# Patient Record
Sex: Male | Born: 1935 | Race: White | Hispanic: No | Marital: Married | Smoking: Never smoker
Health system: Southern US, Community
[De-identification: ages and names within clinical notes are randomized; demographics above are authoritative.]

## PROBLEM LIST (undated history)

## (undated) DIAGNOSIS — K219 Gastro-esophageal reflux disease without esophagitis: Secondary | ICD-10-CM

## (undated) DIAGNOSIS — I4819 Other persistent atrial fibrillation: Secondary | ICD-10-CM

## (undated) DIAGNOSIS — I214 Non-ST elevation (NSTEMI) myocardial infarction: Secondary | ICD-10-CM

## (undated) DIAGNOSIS — I10 Essential (primary) hypertension: Secondary | ICD-10-CM

## (undated) DIAGNOSIS — I2510 Atherosclerotic heart disease of native coronary artery without angina pectoris: Secondary | ICD-10-CM

## (undated) DIAGNOSIS — R7989 Other specified abnormal findings of blood chemistry: Secondary | ICD-10-CM

## (undated) DIAGNOSIS — E7800 Pure hypercholesterolemia, unspecified: Secondary | ICD-10-CM

## (undated) DIAGNOSIS — Z8674 Personal history of sudden cardiac arrest: Secondary | ICD-10-CM

## (undated) DIAGNOSIS — I5022 Chronic systolic (congestive) heart failure: Secondary | ICD-10-CM

## (undated) DIAGNOSIS — Z9289 Personal history of other medical treatment: Secondary | ICD-10-CM

## (undated) DIAGNOSIS — I82409 Acute embolism and thrombosis of unspecified deep veins of unspecified lower extremity: Secondary | ICD-10-CM

## (undated) DIAGNOSIS — Z95828 Presence of other vascular implants and grafts: Secondary | ICD-10-CM

## (undated) DIAGNOSIS — I255 Ischemic cardiomyopathy: Secondary | ICD-10-CM

## (undated) DIAGNOSIS — I420 Dilated cardiomyopathy: Secondary | ICD-10-CM

## (undated) DIAGNOSIS — I749 Embolism and thrombosis of unspecified artery: Secondary | ICD-10-CM

## (undated) DIAGNOSIS — Z955 Presence of coronary angioplasty implant and graft: Secondary | ICD-10-CM

## (undated) DIAGNOSIS — M109 Gout, unspecified: Secondary | ICD-10-CM

## (undated) DIAGNOSIS — E119 Type 2 diabetes mellitus without complications: Secondary | ICD-10-CM

## (undated) DIAGNOSIS — I350 Nonrheumatic aortic (valve) stenosis: Secondary | ICD-10-CM

## (undated) DIAGNOSIS — I2699 Other pulmonary embolism without acute cor pulmonale: Secondary | ICD-10-CM

## (undated) DIAGNOSIS — Z8719 Personal history of other diseases of the digestive system: Secondary | ICD-10-CM

## (undated) HISTORY — DX: Dilated cardiomyopathy: I42.0

## (undated) HISTORY — DX: Nonrheumatic aortic (valve) stenosis: I35.0

## (undated) HISTORY — PX: CARDIAC CATHETERIZATION: SHX172

## (undated) HISTORY — DX: Ischemic cardiomyopathy: I25.5

## (undated) HISTORY — PX: GLAUCOMA SURGERY: SHX656

## (undated) HISTORY — PX: APPENDECTOMY: SHX54

## (undated) HISTORY — DX: Chronic systolic (congestive) heart failure: I50.22

## (undated) HISTORY — PX: CORONARY ANGIOPLASTY WITH STENT PLACEMENT: SHX49

## (undated) HISTORY — DX: Other persistent atrial fibrillation: I48.19

## (undated) HISTORY — PX: TIBIA FRACTURE SURGERY: SHX806

---

## 1989-05-07 HISTORY — PX: AORTA - BILATERAL FEMORAL ARTERY BYPASS GRAFT: SHX1175

## 2013-12-17 ENCOUNTER — Emergency Department (HOSPITAL_COMMUNITY): Payer: Medicare Other

## 2013-12-17 ENCOUNTER — Encounter (HOSPITAL_COMMUNITY): Payer: Self-pay | Admitting: Emergency Medicine

## 2013-12-17 ENCOUNTER — Inpatient Hospital Stay (HOSPITAL_COMMUNITY)
Admission: EM | Admit: 2013-12-17 | Discharge: 2013-12-19 | DRG: 280 | Disposition: A | Discharge status: Home / Self Care | Payer: Medicare Other | Attending: Internal Medicine | Admitting: Internal Medicine

## 2013-12-17 DIAGNOSIS — Z86711 Personal history of pulmonary embolism: Secondary | ICD-10-CM

## 2013-12-17 DIAGNOSIS — Z7901 Long term (current) use of anticoagulants: Secondary | ICD-10-CM

## 2013-12-17 DIAGNOSIS — N179 Acute kidney failure, unspecified: Secondary | ICD-10-CM | POA: Diagnosis not present

## 2013-12-17 DIAGNOSIS — I252 Old myocardial infarction: Secondary | ICD-10-CM

## 2013-12-17 DIAGNOSIS — I350 Nonrheumatic aortic (valve) stenosis: Secondary | ICD-10-CM

## 2013-12-17 DIAGNOSIS — I2510 Atherosclerotic heart disease of native coronary artery without angina pectoris: Secondary | ICD-10-CM

## 2013-12-17 DIAGNOSIS — I214 Non-ST elevation (NSTEMI) myocardial infarction: Principal | ICD-10-CM

## 2013-12-17 DIAGNOSIS — R079 Chest pain, unspecified: Secondary | ICD-10-CM

## 2013-12-17 DIAGNOSIS — Z9861 Coronary angioplasty status: Secondary | ICD-10-CM

## 2013-12-17 DIAGNOSIS — Z79899 Other long term (current) drug therapy: Secondary | ICD-10-CM

## 2013-12-17 DIAGNOSIS — I2699 Other pulmonary embolism without acute cor pulmonale: Secondary | ICD-10-CM | POA: Diagnosis present

## 2013-12-17 DIAGNOSIS — I509 Heart failure, unspecified: Secondary | ICD-10-CM

## 2013-12-17 DIAGNOSIS — I5023 Acute on chronic systolic (congestive) heart failure: Secondary | ICD-10-CM | POA: Diagnosis present

## 2013-12-17 DIAGNOSIS — E1169 Type 2 diabetes mellitus with other specified complication: Secondary | ICD-10-CM | POA: Diagnosis present

## 2013-12-17 DIAGNOSIS — I10 Essential (primary) hypertension: Secondary | ICD-10-CM | POA: Diagnosis present

## 2013-12-17 DIAGNOSIS — J9819 Other pulmonary collapse: Secondary | ICD-10-CM | POA: Diagnosis present

## 2013-12-17 DIAGNOSIS — I5022 Chronic systolic (congestive) heart failure: Secondary | ICD-10-CM | POA: Insufficient documentation

## 2013-12-17 DIAGNOSIS — Z95828 Presence of other vascular implants and grafts: Secondary | ICD-10-CM

## 2013-12-17 DIAGNOSIS — T502X5A Adverse effect of carbonic-anhydrase inhibitors, benzothiadiazides and other diuretics, initial encounter: Secondary | ICD-10-CM | POA: Diagnosis not present

## 2013-12-17 DIAGNOSIS — I739 Peripheral vascular disease, unspecified: Secondary | ICD-10-CM | POA: Diagnosis present

## 2013-12-17 DIAGNOSIS — H9190 Unspecified hearing loss, unspecified ear: Secondary | ICD-10-CM | POA: Diagnosis present

## 2013-12-17 DIAGNOSIS — M109 Gout, unspecified: Secondary | ICD-10-CM | POA: Diagnosis present

## 2013-12-17 DIAGNOSIS — Z8674 Personal history of sudden cardiac arrest: Secondary | ICD-10-CM

## 2013-12-17 DIAGNOSIS — Z7982 Long term (current) use of aspirin: Secondary | ICD-10-CM

## 2013-12-17 DIAGNOSIS — E119 Type 2 diabetes mellitus without complications: Secondary | ICD-10-CM

## 2013-12-17 DIAGNOSIS — I4891 Unspecified atrial fibrillation: Secondary | ICD-10-CM | POA: Diagnosis present

## 2013-12-17 DIAGNOSIS — R7989 Other specified abnormal findings of blood chemistry: Secondary | ICD-10-CM

## 2013-12-17 DIAGNOSIS — I498 Other specified cardiac arrhythmias: Secondary | ICD-10-CM | POA: Diagnosis present

## 2013-12-17 DIAGNOSIS — I359 Nonrheumatic aortic valve disorder, unspecified: Secondary | ICD-10-CM | POA: Diagnosis present

## 2013-12-17 DIAGNOSIS — R778 Other specified abnormalities of plasma proteins: Secondary | ICD-10-CM

## 2013-12-17 HISTORY — DX: Personal history of other diseases of the digestive system: Z87.19

## 2013-12-17 HISTORY — DX: Other specified abnormal findings of blood chemistry: R79.89

## 2013-12-17 HISTORY — DX: Gout, unspecified: M10.9

## 2013-12-17 HISTORY — DX: Non-ST elevation (NSTEMI) myocardial infarction: I21.4

## 2013-12-17 HISTORY — DX: Acute embolism and thrombosis of unspecified deep veins of unspecified lower extremity: I82.409

## 2013-12-17 HISTORY — DX: Nonrheumatic aortic (valve) stenosis: I35.0

## 2013-12-17 HISTORY — DX: Presence of other vascular implants and grafts: Z95.828

## 2013-12-17 HISTORY — DX: Pure hypercholesterolemia, unspecified: E78.00

## 2013-12-17 HISTORY — DX: Personal history of other medical treatment: Z92.89

## 2013-12-17 HISTORY — DX: Gastro-esophageal reflux disease without esophagitis: K21.9

## 2013-12-17 HISTORY — DX: Type 2 diabetes mellitus without complications: E11.9

## 2013-12-17 HISTORY — DX: Presence of coronary angioplasty implant and graft: Z95.5

## 2013-12-17 HISTORY — DX: Personal history of sudden cardiac arrest: Z86.74

## 2013-12-17 HISTORY — DX: Embolism and thrombosis of unspecified artery: I74.9

## 2013-12-17 HISTORY — DX: Atherosclerotic heart disease of native coronary artery without angina pectoris: I25.10

## 2013-12-17 HISTORY — DX: Other pulmonary embolism without acute cor pulmonale: I26.99

## 2013-12-17 HISTORY — DX: Other specified abnormalities of plasma proteins: R77.8

## 2013-12-17 HISTORY — DX: Essential (primary) hypertension: I10

## 2013-12-17 LAB — CBC
HCT: 46.4 % (ref 39.0–52.0)
HEMOGLOBIN: 15.6 g/dL (ref 13.0–17.0)
MCH: 31 pg (ref 26.0–34.0)
MCHC: 33.6 g/dL (ref 30.0–36.0)
MCV: 92.2 fL (ref 78.0–100.0)
Platelets: 209 10*3/uL (ref 150–400)
RBC: 5.03 MIL/uL (ref 4.22–5.81)
RDW: 14.3 % (ref 11.5–15.5)
WBC: 10.9 10*3/uL — ABNORMAL HIGH (ref 4.0–10.5)

## 2013-12-17 LAB — GLUCOSE, CAPILLARY
GLUCOSE-CAPILLARY: 215 mg/dL — AB (ref 70–99)
GLUCOSE-CAPILLARY: 175 mg/dL — AB (ref 70–99)
Glucose-Capillary: 230 mg/dL — ABNORMAL HIGH (ref 70–99)
Glucose-Capillary: 163 mg/dL — ABNORMAL HIGH (ref 70–99)

## 2013-12-17 LAB — BASIC METABOLIC PANEL
BUN: 19 mg/dL (ref 6–23)
CALCIUM: 11.7 mg/dL — AB (ref 8.4–10.5)
CO2: 22 mEq/L (ref 19–32)
Chloride: 97 mEq/L (ref 96–112)
Creatinine, Ser: 1.26 mg/dL (ref 0.50–1.35)
GFR, EST AFRICAN AMERICAN: 59 mL/min — AB (ref >90)
GFR, EST NON AFRICAN AMERICAN: 51 mL/min — AB (ref >90)
Glucose, Bld: 233 mg/dL — ABNORMAL HIGH (ref 70–99)
Potassium: 4.7 mEq/L (ref 3.7–5.3)
SODIUM: 138 meq/L (ref 137–147)

## 2013-12-17 LAB — INFLUENZA PANEL BY PCR (TYPE A & B)
H1N1 flu by pcr: NOT DETECTED
INFLAPCR: NEGATIVE
Influenza B By PCR: NEGATIVE

## 2013-12-17 LAB — PRO B NATRIURETIC PEPTIDE: Pro B Natriuretic peptide (BNP): 2955 pg/mL — ABNORMAL HIGH (ref 0–450)

## 2013-12-17 LAB — I-STAT TROPONIN, ED: TROPONIN I, POC: 0 ng/mL (ref 0.00–0.08)

## 2013-12-17 LAB — PROTIME-INR
INR: 2.23 — ABNORMAL HIGH (ref 0.00–1.49)
Prothrombin Time: 24 seconds — ABNORMAL HIGH (ref 11.6–15.2)

## 2013-12-17 LAB — TSH: TSH: 2.78 u[IU]/mL (ref 0.350–4.500)

## 2013-12-17 LAB — STREP PNEUMONIAE URINARY ANTIGEN: STREP PNEUMO URINARY ANTIGEN: NEGATIVE

## 2013-12-17 LAB — TROPONIN I
Troponin I: <0.3 ng/mL (ref <0.30)
Troponin I: 0.34 ng/mL (ref <0.30)
Troponin I: <0.3 ng/mL (ref <0.30)

## 2013-12-17 MED ORDER — ONDANSETRON HCL 4 MG/2ML IJ SOLN
4.0000 mg | Freq: Once | INTRAMUSCULAR | Status: AC
  Administered 2013-12-17: 4 mg via INTRAVENOUS

## 2013-12-17 MED ORDER — LISINOPRIL 40 MG PO TABS
40.0000 mg | ORAL_TABLET | Freq: Every day | ORAL | Status: DC
  Administered 2013-12-17: 40 mg via ORAL
  Filled 2013-12-17 (×2): qty 1

## 2013-12-17 MED ORDER — INSULIN ASPART 100 UNIT/ML ~~LOC~~ SOLN: 0.0000 [IU] | Freq: Every day | SUBCUTANEOUS | Status: DC

## 2013-12-17 MED ORDER — ONDANSETRON HCL 4 MG PO TABS: 4.0000 mg | ORAL_TABLET | Freq: Four times a day (QID) | ORAL | Status: DC | PRN

## 2013-12-17 MED ORDER — FUROSEMIDE 10 MG/ML IJ SOLN
40.0000 mg | Freq: Two times a day (BID) | INTRAMUSCULAR | Status: DC
  Administered 2013-12-17 – 2013-12-18 (×2): 40 mg via INTRAVENOUS
  Filled 2013-12-17 (×4): qty 4

## 2013-12-17 MED ORDER — ASPIRIN EC 81 MG PO TBEC
81.0000 mg | DELAYED_RELEASE_TABLET | Freq: Every day | ORAL | Status: DC
  Administered 2013-12-17: 81 mg via ORAL
  Filled 2013-12-17: qty 1

## 2013-12-17 MED ORDER — ASPIRIN EC 325 MG PO TBEC
325.0000 mg | DELAYED_RELEASE_TABLET | Freq: Every day | ORAL | Status: DC
  Administered 2013-12-17 – 2013-12-19 (×3): 325 mg via ORAL
  Filled 2013-12-17 (×3): qty 1

## 2013-12-17 MED ORDER — FENOFIBRATE 54 MG PO TABS
54.0000 mg | ORAL_TABLET | Freq: Every day | ORAL | Status: DC
  Administered 2013-12-17 – 2013-12-19 (×3): 54 mg via ORAL
  Filled 2013-12-17 (×3): qty 1

## 2013-12-17 MED ORDER — WARFARIN SODIUM 5 MG PO TABS
5.0000 mg | ORAL_TABLET | ORAL | Status: DC
  Administered 2013-12-17 – 2013-12-18 (×2): 5 mg via ORAL
  Filled 2013-12-17 (×3): qty 1

## 2013-12-17 MED ORDER — FUROSEMIDE 10 MG/ML IJ SOLN
40.0000 mg | Freq: Once | INTRAMUSCULAR | Status: AC
  Administered 2013-12-17: 40 mg via INTRAVENOUS
  Filled 2013-12-17: qty 4

## 2013-12-17 MED ORDER — SODIUM CHLORIDE 0.9 % IJ SOLN
3.0000 mL | Freq: Two times a day (BID) | INTRAMUSCULAR | Status: DC
  Administered 2013-12-17 – 2013-12-19 (×5): 3 mL via INTRAVENOUS

## 2013-12-17 MED ORDER — WARFARIN - PHARMACIST DOSING INPATIENT
Freq: Every day | Status: DC
  Administered 2013-12-17 – 2013-12-18 (×2)

## 2013-12-17 MED ORDER — TIMOLOL MALEATE 0.5 % OP SOLN
1.0000 [drp] | Freq: Every day | OPHTHALMIC | Status: DC
  Administered 2013-12-17 – 2013-12-19 (×3): 1 [drp] via OPHTHALMIC
  Filled 2013-12-17: qty 5

## 2013-12-17 MED ORDER — ACETAMINOPHEN 325 MG PO TABS: 650.0000 mg | ORAL_TABLET | Freq: Four times a day (QID) | ORAL | Status: DC | PRN

## 2013-12-17 MED ORDER — ALLOPURINOL 150 MG HALF TABLET
150.0000 mg | ORAL_TABLET | Freq: Every day | ORAL | Status: DC
  Administered 2013-12-17 – 2013-12-19 (×3): 150 mg via ORAL
  Filled 2013-12-17 (×3): qty 1

## 2013-12-17 MED ORDER — ACETAMINOPHEN 650 MG RE SUPP: 650.0000 mg | Freq: Four times a day (QID) | RECTAL | Status: DC | PRN

## 2013-12-17 MED ORDER — INSULIN ASPART 100 UNIT/ML ~~LOC~~ SOLN
0.0000 [IU] | Freq: Three times a day (TID) | SUBCUTANEOUS | Status: DC
  Administered 2013-12-17: 3 [IU] via SUBCUTANEOUS
  Administered 2013-12-17 (×2): 5 [IU] via SUBCUTANEOUS
  Administered 2013-12-18 (×2): 2 [IU] via SUBCUTANEOUS
  Administered 2013-12-18 – 2013-12-19 (×2): 5 [IU] via SUBCUTANEOUS
  Administered 2013-12-19: 3 [IU] via SUBCUTANEOUS

## 2013-12-17 MED ORDER — ATORVASTATIN CALCIUM 40 MG PO TABS
40.0000 mg | ORAL_TABLET | Freq: Every day | ORAL | Status: DC
  Administered 2013-12-17 – 2013-12-18 (×2): 40 mg via ORAL
  Filled 2013-12-17 (×3): qty 1

## 2013-12-17 MED ORDER — ONDANSETRON HCL 4 MG/2ML IJ SOLN
4.0000 mg | Freq: Once | INTRAMUSCULAR | Status: DC
  Filled 2013-12-17: qty 2

## 2013-12-17 MED ORDER — ONDANSETRON HCL 4 MG/2ML IJ SOLN: 4.0000 mg | Freq: Four times a day (QID) | INTRAMUSCULAR | Status: DC | PRN

## 2013-12-17 NOTE — H&P (Signed)
[facility] History and Physical  Patient: Omar Walker  ZOX:[id number]  DOB: [birth year]  DOS: the patient was seen and examined on 12/17/2013 PCP: No PCP Per Patient  Chief Complaint: Shortness of breath  HPI: Omar Walker is a 80 y.o. male with Past medical history of coronary artery disease status post PCI, diabetes mellitus, hypertension, aortic wall disease, low ejection fraction, atrial fibrillation, pulmonary embolism, peripheral vascular disease with history of aortofemoral bypass, history of cardiac arrest, The patient presented with complaints of shortness of breath that has been progressively worsening over last 2 days. As per the family he does have baseline shortness of breath and he gets short of breath walking up to his mailbox. Since last 2 days he has been having progressively worsening shortness of breath with minimal exertion and since today he has been short of breath even at rest and has been in his recliner throughout the day. He denies any complaint of chest pain chest tightness. He does mentions that when he takes a deep breath he feels that there is restriction of air. He denies any nausea or vomiting no acid reflux no dizziness no lightheadedness no syncopal events no diarrhea no constipation no burning urination or decreased urination no active bleeding. Family mentions his ejection fraction is around 30 person and he has aortic wall disease for which his cardiologist has recommended guarded prognosis mentioning eventually he would have difficulty with breathing due to worsening fluid status and patient has refused any type of surgery for that particular situation.  Since last December he has been here in [place] otherwise he lives up in [place] and has his primary physician as well as primary cardiologist in [place]. Patient has been treated with at least 3 rounds of antibiotics amoxicillin, levofloxacin, azithromycin for subacute bronchitis as seen primary  care physician as well as ENT here. His chest x-ray on March 13 15 was negative for any acute abnormality as per family. He also had a CT maxilla sinus which was negative for sinusitis. One week ago patient had a sudden episode of aspiration and was coughing and choking which lasted for a few minutes after which a she was tired for the whole day. He did not have any further aspiration events or progressively worsening shortness of breath the next day.  The patient is coming from home. And at her baseline Independent for most of his  ADL.  Review of Systems: as mentioned in the history of present illness.  A Comprehensive review of the other systems is negative.  Past Medical History  Diagnosis Date  . Coronary artery disease   . Stented coronary artery   . Diabetes mellitus without complication   . Hypertension   . Aortic valve insufficiency   . CHF (congestive heart failure)   . AF (atrial fibrillation)   . Embolism and thrombosis   . PE (pulmonary embolism) 12/17/2013  . H/O cardiac arrest 12/17/2013  . H/O aorto-femoral bypass 12/17/2013  . Diabetes mellitus 12/17/2013  . Gout 12/17/2013  . Decreased cardiac ejection fraction 12/17/2013  . Aortic valvular disease 12/17/2013   No past surgical history on file. Social History:  reports that he has never smoked. He does not have any smokeless tobacco history on file. He reports that he does not drink alcohol. His drug history is not on file.  Allergies  Allergen Reactions  . Milk-Related Compounds     Can tolerate ice creams.     No family history on file.  Prior to Admission medications   Medication Sig Start Date End Date Taking? Authorizing Provider  allopurinol (ZYLOPRIM) 300 MG tablet Take 150 mg by mouth daily.   Yes Historical Provider, MD  aspirin EC 81 MG tablet Take 81 mg by mouth daily.   Yes Historical Provider, MD  Cholecalciferol (VITAMIN D PO) Take 2 tablets by mouth daily.   Yes Historical Provider, MD  fenofibrate  54 MG tablet Take 54 mg by mouth daily.   Yes Historical Provider, MD  glimepiride (AMARYL) 1 MG tablet Take 1 mg by mouth 2 (two) times daily.   Yes Historical Provider, MD  lisinopril (PRINIVIL,ZESTRIL) 40 MG tablet Take 40 mg by mouth daily.   Yes Historical Provider, MD  simvastatin (ZOCOR) 80 MG tablet Take 80 mg by mouth every evening.   Yes Historical Provider, MD  sitaGLIPtin (JANUVIA) 50 MG tablet Take 50 mg by mouth daily.   Yes Historical Provider, MD  timolol (TIMOPTIC) 0.5 % ophthalmic solution Place 1 drop into the left eye daily.   Yes Historical Provider, MD  warfarin (COUMADIN) 5 MG tablet Take 5 mg by mouth See admin instructions. Take every day except Saturday   Yes Historical Provider, MD    Physical Exam: Filed Vitals:   12/17/13 0530 12/17/13 0545 12/17/13 0600 12/17/13 0615  BP: 148/82 143/93 143/79 140/78  Pulse: 103 106 104 105  Temp:      TempSrc:      Resp: 24 24 24 27   Height:      Weight:      SpO2: 96% 95% 95% 97%    General: Alert, Awake and Oriented to Time, Place and Person. Appear in moderate distress Eyes: PERRL ENT: Oral Mucosa clear moist. Neck: Mild JVD Cardiovascular: S1 and S2 Present, aortic systolic Murmur, Peripheral Pulses Present Respiratory: Bilateral Air entry equal and Decreased, bilateral basal crackles, no wheezes Abdomen: Bowel Sound Present, Soft and distended, Non tender Skin: No Rash Extremities: Trace bilateral Pedal edema, no calf tenderness Neurologic: Grossly Unremarkable.  Labs on Admission:  CBC:  Recent Labs Lab 12/17/13 0358  WBC 10.9*  HGB 15.6  HCT 46.4  MCV 92.2  PLT 209    CMP     Component Value Date/Time   NA 138 12/17/2013 0358   K 4.7 12/17/2013 0358   CL 97 12/17/2013 0358   CO2 22 12/17/2013 0358   GLUCOSE 233* 12/17/2013 0358   BUN 19 12/17/2013 0358   CREATININE 1.26 12/17/2013 0358   CALCIUM 11.7* 12/17/2013 0358   GFRNONAA 51* 12/17/2013 0358   GFRAA 59* 12/17/2013 0358    No results found  for this basename: LIPASE, AMYLASE,  in the last 168 hours No results found for this basename: AMMONIA,  in the last 168 hours  No results found for this basename: CKTOTAL, CKMB, CKMBINDEX, TROPONINI,  in the last 168 hours BNP (last 3 results)  Recent Labs  12/17/13 0358  PROBNP 2955.0*    Radiological Exams on Admission: Dg Chest Port 1 View  12/17/2013   CLINICAL DATA:  Shortness of breath  EXAM: PORTABLE CHEST - 1 VIEW  COMPARISON:  CT ABD-PEL WO/W CM dated 11/02/2011  FINDINGS: There are bilateral chronic bronchitic changes. There is hazy left lower lobe airspace disease which may represent atelectasis versus pneumonia. There is no pleural effusion or pneumothorax. There is mild elevation of the right diaphragm. There is mild cardiomegaly. The osseous structures are unremarkable.  IMPRESSION: Hazy left lower lobe airspace disease which may reflect  atelectasis versus pneumonia.   Electronically Signed   By: Elige KoHetal  Trevon Strothers   On: 12/17/2013 04:19    EKG: Independently reviewed. sinus tachycardia, frequent PVC's noted.  Assessment/Plan Principal Problem:   Acute on chronic systolic CHF (congestive heart failure) Active Problems:   PE (pulmonary embolism)   H/O cardiac arrest   H/O aorto-femoral bypass   Coronary artery disease   Diabetes mellitus   Gout   Decreased cardiac ejection fraction   Aortic valvular disease   1. Acute on chronic systolic CHF (congestive heart failure) The patient is presenting with complaints of shortness of breath orthopnea and PND. He has elevated proBNP, sinus tachycardia, negative for any acute ischemia, normal troponin. I have compared his chest x-ray to his prior chest x-ray done 1 month ago and there is definite bilateral pleural effusion, possible left-sided atelectasis/infiltrate, vascular congestion. Patient has received IV Lasix and feels symptomatically better and has diuresed well. Family mentions that his ejection fraction is around 30%  At  present the patient will be admitted to the hospital for acute on chronic systolic CHF. I would continue him with IV diuresis, follow serial troponin, get echocardiogram. Check TSH, control heart rate.  2. Left-sided atelectasis Patient has mild leukocytosis but no fever, he mentions his cough is getting better after his last course of antibiotics, has been on multiple antibiotics in the last 3 months. At present I will update sputum culture, urine antigens, influenza PCR, I would hold off on antibiotics at present and treat symptomatically. Antibiotics and blood culture if patient spikes fever  3. Diabetes mellitus Holding oral hypoglycemic and placing the patient on sliding scale  4. History of PE History of A. fib next and continue Coumadin per pharmacy  DVT Prophylaxis: Continue Coumadin Nutrition: Cardiac and diabetic diet  Code Status: Full, patient wants to attempt resuscitation as he had good outcomes with his prior resuscitation but does not want to prolong his life on life support. Discussed in the presence of daughter and wife  Family Communication: Family was present at bedside, opportunity was given to ask question and all questions were answered satisfactorily at the time of interview. Disposition: Admitted to inpatient in telemetry unit.  Author: Lynden OxfordPranav Ahrianna Siglin, MD [facility] Pager: [phone] 12/17/2013, 6:36 AM    If 7PM-7AM, please contact night-coverage [email or web address] Password [email or web address]

## 2013-12-17 NOTE — Consult Note (Signed)
Patient seen, examined and chart and labs reviewed.  Admitted with complaints of increasing SOB over several days with increased LE edema.  He has baseline SOB but this is much worse.  He has recently finished several rounds of antibiotics for bronchitis.  After admission he had a brief episode of sharp chest pain that was different from his CP with his MI in the past.  He says when he takes a deep breath in he feels like he cannot get air.  His last NSTEMI was 2 years ago and last cath in 2001 when he had a PCI.  His wife said that no PCI was done 2 years ago due to too many comorbidities.  [place] Cardiologist is in [place].  His proBNP is elevated along with LE edema c/w acute on chronic systolic CHF.  Recommend continuing IV diuresis as you are doing.  Check 2D echo (last assessment of EF about 3 years ago), and get old records from [place] Cardiologist.  Continue to cycle cardiac enzymes to determine the trend. Elevated Troponin most likely secondary to Type II NSTEMI from demand ischemia from CHF.  Continue Coumadin therapy for now.

## 2013-12-17 NOTE — Progress Notes (Signed)
UR completed Keisa Blow K. Yamile Roedl, RN, BSN, MSHL, CCM  12/17/2013 2:36 PM

## 2013-12-17 NOTE — Care Management Note (Addendum)
  Page 1 of 1   12/17/2013     2:39:32 PM   CARE MANAGEMENT NOTE 12/17/2013  Patient:  Omar Walker,Omar Walker   Account Number:  [id number]  Date Initiated:  12/17/2013  Documentation initiated by:  Omar Walker,Omar Walker  Subjective/Objective Assessment:   Admitted with CHF, hx/o a-fib, DVT     Action/Plan:   CM to follow for dispositon needs   Anticipated DC Date:  12/20/2013   Anticipated DC Plan:  HOME/SELF CARE         Choice offered to / List presented to:             Status of service:  In process, will continue to follow Medicare Important Message given?   (If response is "NO", the following Medicare IM given date fields will be blank) Date Medicare IM given:   Date Additional Medicare IM given:    Discharge Disposition:    Per UR Regulation:  Reviewed for med. necessity/level of care/duration of stay  If discussed at Long Length of Stay Meetings, dates discussed:    Comments:  Social:  from home.  From [place] but has been in [place] since Dec. 2014. PCP: Cardioloigist:  [place] Omar Thielen, RN, BSN, [place], CCM 12/17/2013

## 2013-12-17 NOTE — ED Notes (Signed)
Attempted report x1. 

## 2013-12-17 NOTE — Progress Notes (Signed)
The patient arrived to 3E09 from the ED at 0650.  He was oriented to the unit and placed on telemetry.  He is A&Ox4 and does not have any complaints of pain at this time.  He does have dyspnea on exertion, but states that he is already feeling better from the Lasix that was given in the ED.  His O2 sats are 98% on 2L.

## 2013-12-17 NOTE — ED Provider Notes (Signed)
CSN: [id number]     Arrival date & time 12/17/13  [id number] History   First MD Initiated Contact with Patient 12/17/13 [phone]     Chief Complaint  Patient presents with  . Shortness of Breath     (Consider location/radiation/quality/duration/timing/severity/associated sxs/prior Treatment) HPI 80 year old male presents to the emergency department from home with complaint of worsening shortness of breath.  Symptoms started 2-3 days ago and got progressively worse.  Patient has history of coronary disease, status post MI, and stent, diabetes, hypertension, CHF, atrial fibrillation, PE.  Patient denies missing any doses of his medication.  Patient does not ambulate much at baseline, has severe dyspnea on exertion.  He reports severe or orthopnea at baseline.  Patient reports he has chest pain when he is taking a very deep breath.  Otherwise he is chest pain-free. Past Medical History  Diagnosis Date  . Coronary artery disease   . Stented coronary artery   . Diabetes mellitus without complication   . Hypertension   . Aortic valve insufficiency   . CHF (congestive heart failure)   . AF (atrial fibrillation)   . Embolism and thrombosis    No past surgical history on file. No family history on file. History  Substance Use Topics  . Smoking status: Never Smoker   . Smokeless tobacco: Not on file  . Alcohol Use: No    Review of Systems  See History of Present Illness; otherwise all other systems are reviewed and negative   Allergies  Review of patient's allergies indicates no known allergies.  Home Medications  No current outpatient prescriptions on file. BP 151/75  Pulse 101  Temp(Src) 97.6 F (36.4 C) (Oral)  Resp 20  Ht 5\' 11"  (1.803 m)  Wt 207 lb (93.895 kg)  BMI 28.88 kg/m2  SpO2 99% Physical Exam  Nursing note and vitals reviewed. Constitutional: He is oriented to person, place, and time. He appears well-developed and well-nourished. He appears distressed.  HENT:  Head:  Normocephalic and atraumatic.  Nose: Nose normal.  Mouth/Throat: Oropharynx is clear and moist.  Eyes: Conjunctivae and EOM are normal. Pupils are equal, round, and reactive to light.  Neck: Normal range of motion. Neck supple. No JVD present. No tracheal deviation present. No thyromegaly present.  Cardiovascular: Regular rhythm, normal heart sounds and intact distal pulses.  Exam reveals no gallop and no friction rub.   No murmur heard. Frequent ectopy noted  Pulmonary/Chest: No stridor. He is in respiratory distress. He has no wheezes. He has rales. He exhibits no tenderness.  Patient has tachypnea, shallow breaths, appears uncomfortable with breathing  Abdominal: Soft. Bowel sounds are normal. He exhibits no distension and no mass. There is no tenderness. There is no rebound and no guarding.  Musculoskeletal: Normal range of motion. He exhibits edema (1+ pitting bilaterally). He exhibits no tenderness.  Lymphadenopathy:    He has no cervical adenopathy.  Neurological: He is alert and oriented to person, place, and time. He exhibits normal muscle tone. Coordination normal.  Skin: Skin is warm and dry. No rash noted. No erythema. No pallor.  Psychiatric: He has a normal mood and affect. His behavior is normal. Judgment and thought content normal.    ED Course  Procedures (including critical care time) Labs Review Labs Reviewed  CBC - Abnormal; Notable for the following:    WBC 10.9 (*)    All other components within normal limits  BASIC METABOLIC PANEL - Abnormal; Notable for the following:    Glucose, Bld  233 (*)    Calcium 11.7 (*)    GFR calc non Af Amer 51 (*)    GFR calc Af Amer 59 (*)    All other components within normal limits  PRO B NATRIURETIC PEPTIDE - Abnormal; Notable for the following:    Pro B Natriuretic peptide (BNP) 2955.0 (*)    All other components within normal limits  PROTIME-INR - Abnormal; Notable for the following:    Prothrombin Time 24.0 (*)    INR  2.23 (*)    All other components within normal limits  Rosezena SensorI-STAT TROPOININ, ED   Imaging Review Dg Chest Port 1 View  12/17/2013   CLINICAL DATA:  Shortness of breath  EXAM: PORTABLE CHEST - 1 VIEW  COMPARISON:  CT ABD-PEL WO/W CM dated 11/02/2011  FINDINGS: There are bilateral chronic bronchitic changes. There is hazy left lower lobe airspace disease which may represent atelectasis versus pneumonia. There is no pleural effusion or pneumothorax. There is mild elevation of the right diaphragm. There is mild cardiomegaly. The osseous structures are unremarkable.  IMPRESSION: Hazy left lower lobe airspace disease which may reflect atelectasis versus pneumonia.   Electronically Signed   By: Elige KoHetal  Patel   On: 12/17/2013 04:19     EKG Interpretation   Date/Time:  Monday December 17 2013 03:38:53 EDT Ventricular Rate:  106 PR Interval:  174 QRS Duration: 138 QT Interval:  360 QTC Calculation: 478 R Axis:   -40 Text Interpretation:  Sinus tachycardia with frequent Premature  ventricular complexes and Fusion complexes Left axis deviation Left  ventricular hypertrophy with QRS widening and repolarization abnormality  Possible Lateral infarct , age undetermined Abnormal ECG No old tracing to  compare Confirmed by Milliani Herrada  MD, Jorryn Hershberger ([id number]) on 12/17/2013 4:18:37 AM      MDM   Final diagnoses:  CHF exacerbation    80 year old male with worsening shortness of breath, concern for CHF exacerbation.  Other etiologies would be return of PE, pneumonia, atypical presentation of MI.  Plan for labs, chest x-ray.  EKG with frequent PVCs.  If need, be, we'll give Lasix.  Patient may need admission given worsening dyspnea.    Olivia Mackielga M Makara Lanzo, MD 12/17/13 [phone]

## 2013-12-17 NOTE — Consult Note (Signed)
Reason for Consult: CHF    Referring Physician: Dr. Wynelle Cleveland   No PCP Per Patient Primary Cardiologist:in [place]--Dr. Sandie Ano at [facility] [phone]  Omar Walker is an 80 y.o. male.    Chief Complaint:  Pt admitted around 6:30 AM today after presenting with SOB.  HPI: 80 y.o. male with Past medical history of coronary artery disease status post PCI-2001 with NSTEMI, another NSTEMI 2 years ago but no intervention due to co-morbidities,  diabetes mellitus, hypertension, aortic wall disease, low ejection fraction, atrial fibrillation, pulmonary embolism, peripheral vascular disease with history of aortofemoral bypass, history of cardiac arrest on coumadin.   The patient presented with complaints of shortness of breath that has been progressively worsening over last 2 days. As per the family he does have baseline shortness of breath and he gets short of breath walking up to his mailbox. Since last 2 days he has been having progressively worsening shortness of breath with minimal exertion and since today he has been short of breath even at rest and has been in his recliner throughout the day. He denies any complaint of chest pain chest tightness. He does mentions that when he takes a deep breath he feels that there is restriction of air. He denies any nausea or vomiting no acid reflux no dizziness no lightheadedness no syncopal events no diarrhea no constipation no burning urination or decreased urination no active bleeding.  Later he had episode of brief, fleeting chest pain.   Family mentions his ejection fraction is around 30 person and he has aortic valve disease for which his cardiologist has recommended guarded prognosis mentioning eventually he would have difficulty with breathing due to worsening fluid status and patient has refused any type of surgery for that particular situation.   Since last December he has been here in [place] otherwise he lives up in [place] and has his primary physician as well as primary cardiologist in [place].  Patient has been treated with at least 3 rounds of antibiotics amoxicillin, levofloxacin, azithromycin for subacute bronchitis as seen primary care physician as well as ENT here. His chest x-ray on March 13 15 was negative for any acute abnormality as per family. He also had a CT maxilla sinus which was negative for sinusitis.  One week ago patient had a sudden episode of aspiration and was coughing and choking which lasted for a few minutes after which a she was tired for the whole day. He did not have any further aspiration events or progressively worsening shortness of breath the next day.   His troponin is 0.34, Pro BNP is 2955, INR 2.23, WBC 10.9  EKG on admit, S tach with LAD, PACs    Past Medical History  Diagnosis Date  . Coronary artery disease   . Stented coronary artery   . Diabetes mellitus without complication   . Hypertension   . Aortic valve insufficiency   . CHF (congestive heart failure)   . AF (atrial fibrillation)   . Embolism and thrombosis   . PE (pulmonary embolism) 12/17/2013  . H/O cardiac arrest 12/17/2013  . H/O aorto-femoral bypass 12/17/2013  . Diabetes mellitus 12/17/2013  . Gout 12/17/2013  . Decreased cardiac ejection fraction 12/17/2013  . Aortic valvular disease 12/17/2013  . Elevated troponin level 12/17/2013    Past Surgical History  Procedure Laterality Date  . Cardiac catheterization    . Coronary angioplasty  History reviewed. No pertinent family history. Social History:  reports that he has never smoked. He does not have any smokeless tobacco history on file. He reports that he does not drink alcohol. His drug history is not on file.  Allergies:  Allergies  Allergen Reactions  . Milk-Related Compounds     Can tolerate ice creams.     Medications Prior to Admission  Medication Sig Dispense Refill  . allopurinol (ZYLOPRIM) 300 MG tablet Take 150 mg by mouth  daily.      Marland Kitchen aspirin EC 81 MG tablet Take 81 mg by mouth daily.      . Cholecalciferol (VITAMIN D PO) Take 2 tablets by mouth daily.      . fenofibrate 54 MG tablet Take 54 mg by mouth daily.      Marland Kitchen glimepiride (AMARYL) 1 MG tablet Take 1 mg by mouth 2 (two) times daily.      Marland Kitchen lisinopril (PRINIVIL,ZESTRIL) 40 MG tablet Take 40 mg by mouth daily.      . simvastatin (ZOCOR) 80 MG tablet Take 80 mg by mouth every evening.      . sitaGLIPtin (JANUVIA) 50 MG tablet Take 50 mg by mouth daily.      . timolol (TIMOPTIC) 0.5 % ophthalmic solution Place 1 drop into the left eye daily.      Marland Kitchen warfarin (COUMADIN) 5 MG tablet Take 5 mg by mouth See admin instructions. Take every day except Saturday        Results for orders placed during the hospital encounter of 12/17/13 (from the past 48 hour(s))  CBC     Status: Abnormal   Collection Time    12/17/13  3:58 AM      Result Value Ref Range   WBC 10.9 (*) 4.0 - 10.5 K/uL   RBC 5.03  4.22 - 5.81 MIL/uL   Hemoglobin 15.6  13.0 - 17.0 g/dL   HCT 46.4  39.0 - 52.0 %   MCV 92.2  78.0 - 100.0 fL   MCH 31.0  26.0 - 34.0 pg   MCHC 33.6  30.0 - 36.0 g/dL   RDW 14.3  11.5 - 15.5 %   Platelets 209  150 - 400 K/uL  BASIC METABOLIC PANEL     Status: Abnormal   Collection Time    12/17/13  3:58 AM      Result Value Ref Range   Sodium 138  137 - 147 mEq/L   Potassium 4.7  3.7 - 5.3 mEq/L   Chloride 97  96 - 112 mEq/L   CO2 22  19 - 32 mEq/L   Glucose, Bld 233 (*) 70 - 99 mg/dL   BUN 19  6 - 23 mg/dL   Creatinine, Ser 1.26  0.50 - 1.35 mg/dL   Calcium 11.7 (*) 8.4 - 10.5 mg/dL   GFR calc non Af Amer 51 (*) >90 mL/min   GFR calc Af Amer 59 (*) >90 mL/min   Comment: (NOTE)     The eGFR has been calculated using the CKD EPI equation.     This calculation has not been validated in all clinical situations.     eGFR's persistently <90 mL/min signify possible Chronic Kidney     Disease.  PRO B NATRIURETIC PEPTIDE     Status: Abnormal   Collection Time     12/17/13  3:58 AM      Result Value Ref Range   Pro B Natriuretic peptide (BNP) 2955.0 (*) 0 - 450  pg/mL  PROTIME-INR     Status: Abnormal   Collection Time    12/17/13  3:58 AM      Result Value Ref Range   Prothrombin Time 24.0 (*) 11.6 - 15.2 seconds   INR 2.23 (*) 0.00 - 1.49  I-STAT TROPOININ, ED     Status: None   Collection Time    12/17/13  4:06 AM      Result Value Ref Range   Troponin i, poc 0.00  0.00 - 0.08 ng/mL   Comment 3            Comment: Due to the release kinetics of cTnI,     a negative result within the first hours     of the onset of symptoms does not rule out     myocardial infarction with certainty.     If myocardial infarction is still suspected,     repeat the test at appropriate intervals.  TROPONIN I     Status: None   Collection Time    12/17/13  7:30 AM      Result Value Ref Range   Troponin I <0.30  <0.30 ng/mL   Comment:            Due to the release kinetics of cTnI,     a negative result within the first hours     of the onset of symptoms does not rule out     myocardial infarction with certainty.     If myocardial infarction is still suspected,     repeat the test at appropriate intervals.  TSH     Status: None   Collection Time    12/17/13  7:30 AM      Result Value Ref Range   TSH 2.780  0.350 - 4.500 uIU/mL   Comment: Please note change in reference range.  GLUCOSE, CAPILLARY     Status: Abnormal   Collection Time    12/17/13  7:58 AM      Result Value Ref Range   Glucose-Capillary 230 (*) 70 - 99 mg/dL  GLUCOSE, CAPILLARY     Status: Abnormal   Collection Time    12/17/13 11:36 AM      Result Value Ref Range   Glucose-Capillary 215 (*) 70 - 99 mg/dL  STREP PNEUMONIAE URINARY ANTIGEN     Status: None   Collection Time    12/17/13 11:45 AM      Result Value Ref Range   Strep Pneumo Urinary Antigen NEGATIVE  NEGATIVE   Comment:            Infection due to S. pneumoniae     cannot be absolutely ruled out     since the  antigen present     may be below the detection limit     of the test.  TROPONIN I     Status: Abnormal   Collection Time    12/17/13 12:01 PM      Result Value Ref Range   Troponin I 0.34 (*) <0.30 ng/mL   Comment:            Due to the release kinetics of cTnI,     a negative result within the first hours     of the onset of symptoms does not rule out     myocardial infarction with certainty.     If myocardial infarction is still suspected,     repeat the test at appropriate intervals.  CRITICAL RESULT CALLED TO, READ BACK BY AND VERIFIED WITH:     DONNA OWENS,RN AT 1324/13/15 BY ZBEECH.   Dg Chest Port 1 View  12/17/2013   CLINICAL DATA:  Shortness of breath  EXAM: PORTABLE CHEST - 1 VIEW  COMPARISON:  CT ABD-PEL WO/W CM dated 11/02/2011  FINDINGS: There are bilateral chronic bronchitic changes. There is hazy left lower lobe airspace disease which may represent atelectasis versus pneumonia. There is no pleural effusion or pneumothorax. There is mild elevation of the right diaphragm. There is mild cardiomegaly. The osseous structures are unremarkable.  IMPRESSION: Hazy left lower lobe airspace disease which may reflect atelectasis versus pneumonia.   Electronically Signed   By: Kathreen Devoid   On: 12/17/2013 04:19    ROS: General: + colds, no fevers, no weight changes Skin:no rashes or ulcers HEENT:no blurred vision, no congestion CV:see HPI PUL:see HPI GI:no diarrhea constipation or melena, no indigestion GU:no hematuria, no dysuria MS:no joint pain, no claudication Neuro:no syncope, no lightheadedness Endo:+ diabetes, no thyroid disease   Blood pressure 117/67, pulse 82, temperature 98.7 F (37.1 C), temperature source Oral, resp. rate 20, height 5' 11" (1.803 m), weight 203 lb 11.2 oz (92.398 kg), SpO2 94.00%. PE: General:Pleasant affect, NAD Skin:Warm and dry, brisk capillary refill HEENT:normocephalic, sclera clear, mucus membranes moist, very Hard of hearing, his wife is  with him and answering question. Neck:supple, no JVD, no bruits  Heart:S1S2 RRR without murmur, gallup, rub or click Lungs:few crackles in the bases, no rhonchi, or wheezes XBD:ZHGD, non tender, + BS, do not palpate liver spleen or masses Ext:2+ lower ext edema, 2+ pedal pulses, 2+ radial pulses Neuro:alert and oriented, MAE, follows commands, + facial symmetry    Assessment/Plan Principal Problem:   Acute on chronic systolic CHF (congestive heart failure), Echo pending -700 so far on the 40 mg IV lasix he rec'd in ER.  Will continue lasix for now.  Active Problems:   PE (pulmonary embolism)   H/O cardiac arrest   H/O aorto-femoral bypass   Coronary artery disease, with hx of stent with NSTEMI- 2001, 2 years ago another NSTEMI   Diabetes mellitus   Gout   Decreased cardiac ejection fraction- check echo   Aortic valvular disease- pt has not wanted surgery   Elevated troponin level- could be secondary to CHF, will monitor, pt is anticoagulated on coumdain  Will ask for records from his cardiologist in [place]  Nurse Practitioner Certified [place] Pager [phone] or after 5pm or weekends call [phone] 12/17/2013, 2:18 PM

## 2013-12-17 NOTE — Progress Notes (Addendum)
Pt admitted early this AM I have examined Mr Omar Walker and discussed the plan with him and his wife.  After exam, Tropnin noted to result and is mildly elevated. He is quite hard of hearing and difficult to talk to   A/P 1. CHF unspecified- not recently on diuretic although, per history in Epic, someone in the family said he has an EF of 30%, [place] cardiologist in [place] and just visiting here- f/u on ECHO-  2. Elevated Troponins- add ASA- did not admit to chest pain therefore possibly from above--ask for Cardiology eval  Calvert CantorSaima Nehal Shives, MD

## 2013-12-17 NOTE — ED Notes (Signed)
Patel, MD at bedside.  

## 2013-12-17 NOTE — Progress Notes (Signed)
02 sat at rest prior to ambulation and on room air = 97%  02 sat with ambulation = 93% on room air

## 2013-12-17 NOTE — Progress Notes (Signed)
ANTICOAGULATION CONSULT NOTE - Initial Consult  Pharmacy Consult for coumadin  Indication: atrial fibrillation/PE hx and PVD  Allergies  Allergen Reactions  . Milk-Related Compounds     Can tolerate ice creams.     Patient Measurements: Height: 5\' 11"  (180.3 cm) Weight: 207 lb (93.895 kg) IBW/kg (Calculated) : 75.3 Heparin Dosing Weight:   Vital Signs: Temp: 97.6 F (36.4 C) (04/13 0348) Temp src: Oral (04/13 0348) BP: 140/78 mmHg (04/13 0615) Pulse Rate: 105 (04/13 0615)  Labs:  Recent Labs  12/17/13 0358  HGB 15.6  HCT 46.4  PLT 209  LABPROT 24.0*  INR 2.23*  CREATININE 1.26    Estimated Creatinine Clearance: 51 ml/min (by C-G formula based on Cr of 1.26).   Medical History: Past Medical History  Diagnosis Date  . Coronary artery disease   . Stented coronary artery   . Diabetes mellitus without complication   . Hypertension   . Aortic valve insufficiency   . CHF (congestive heart failure)   . AF (atrial fibrillation)   . Embolism and thrombosis   . PE (pulmonary embolism) 12/17/2013  . H/O cardiac arrest 12/17/2013  . H/O aorto-femoral bypass 12/17/2013  . Diabetes mellitus 12/17/2013  . Gout 12/17/2013  . Decreased cardiac ejection fraction 12/17/2013  . Aortic valvular disease 12/17/2013    Medications:  Prescriptions prior to admission  Medication Sig Dispense Refill  . allopurinol (ZYLOPRIM) 300 MG tablet Take 150 mg by mouth daily.      Marland Kitchen. aspirin EC 81 MG tablet Take 81 mg by mouth daily.      . Cholecalciferol (VITAMIN D PO) Take 2 tablets by mouth daily.      . fenofibrate 54 MG tablet Take 54 mg by mouth daily.      Marland Kitchen. glimepiride (AMARYL) 1 MG tablet Take 1 mg by mouth 2 (two) times daily.      Marland Kitchen. lisinopril (PRINIVIL,ZESTRIL) 40 MG tablet Take 40 mg by mouth daily.      . simvastatin (ZOCOR) 80 MG tablet Take 80 mg by mouth every evening.      . sitaGLIPtin (JANUVIA) 50 MG tablet Take 50 mg by mouth daily.      . timolol (TIMOPTIC) 0.5 %  ophthalmic solution Place 1 drop into the left eye daily.      Marland Kitchen. warfarin (COUMADIN) 5 MG tablet Take 5 mg by mouth See admin instructions. Take every day except Saturday        Assessment: 80 y.o. male with Past medical history of coronary artery disease status post PCI, diabetes mellitus, hypertension, aortic wall disease, low ejection fraction, atrial fibrillation, pulmonary embolism, peripheral vascular disease with history of aortofemoral bypass, history of cardiac arrest,  The patient presented with complaints of shortness of breath that has been progressively worsening over last 2 days   -- acute on chronic CHF, hx of afib and PE and Aortic insuf (native valve) coumadin to continue at home dose of 5 mg daily current INR is 2.23 h/h and PLT are normal  Goal of Therapy:  INR 2-3 Monitor platelets by anticoagulation protocol: Yes   Plan:  Continue coumadin 5 mg daily except Saturday.   Daily INR/PT  Daily CBC  Janice CoffinWilliam Jonathan Kaitlynne Wenz 12/17/2013,7:02 AM

## 2013-12-17 NOTE — ED Notes (Signed)
The pt has     Been sob for several days worse tonight.  He is visiting here from [place] and the pt has been unable to sleep tonight.  Visibly sob at present

## 2013-12-17 NOTE — Progress Notes (Signed)
CRITICAL VALUE ALERT  Critical value received:  Troponin 0.34  Date of notification:  12/17/13  Time of notification:  1330  Critical value read back:yes  Nurse who received alert:  Margaretmary Bayleyonna Ramiro Pangilinan   MD notified (1st page):  Dr. Butler Denmarkizwan via text page  Time of first page:  1332  MD notified (2nd page):  Time of second page:  Responding MD:  Dr Butler Denmarkizwan  Time MD responded:  [phone]

## 2013-12-18 DIAGNOSIS — I369 Nonrheumatic tricuspid valve disorder, unspecified: Secondary | ICD-10-CM

## 2013-12-18 DIAGNOSIS — I5023 Acute on chronic systolic (congestive) heart failure: Secondary | ICD-10-CM

## 2013-12-18 DIAGNOSIS — E119 Type 2 diabetes mellitus without complications: Secondary | ICD-10-CM

## 2013-12-18 DIAGNOSIS — R799 Abnormal finding of blood chemistry, unspecified: Secondary | ICD-10-CM

## 2013-12-18 DIAGNOSIS — I359 Nonrheumatic aortic valve disorder, unspecified: Secondary | ICD-10-CM

## 2013-12-18 LAB — CBC
HCT: 40.8 % (ref 39.0–52.0)
Hemoglobin: 13.6 g/dL (ref 13.0–17.0)
MCH: 30.5 pg (ref 26.0–34.0)
MCHC: 33.3 g/dL (ref 30.0–36.0)
MCV: 91.5 fL (ref 78.0–100.0)
Platelets: 188 10*3/uL (ref 150–400)
RBC: 4.46 MIL/uL (ref 4.22–5.81)
RDW: 14.2 % (ref 11.5–15.5)
WBC: 6.4 10*3/uL (ref 4.0–10.5)

## 2013-12-18 LAB — PROTIME-INR
INR: 2.77 — AB (ref 0.00–1.49)
PROTHROMBIN TIME: 28.3 s — AB (ref 11.6–15.2)

## 2013-12-18 LAB — BASIC METABOLIC PANEL
BUN: 27 mg/dL — AB (ref 6–23)
CALCIUM: 10.6 mg/dL — AB (ref 8.4–10.5)
CO2: 28 mEq/L (ref 19–32)
Chloride: 96 mEq/L (ref 96–112)
Creatinine, Ser: 1.7 mg/dL — ABNORMAL HIGH (ref 0.50–1.35)
GFR, EST AFRICAN AMERICAN: 41 mL/min — AB (ref >90)
GFR, EST NON AFRICAN AMERICAN: 35 mL/min — AB (ref >90)
Glucose, Bld: 157 mg/dL — ABNORMAL HIGH (ref 70–99)
Potassium: 3.8 mEq/L (ref 3.7–5.3)
Sodium: 139 mEq/L (ref 137–147)

## 2013-12-18 LAB — GLUCOSE, CAPILLARY
GLUCOSE-CAPILLARY: 185 mg/dL — AB (ref 70–99)
Glucose-Capillary: 122 mg/dL — ABNORMAL HIGH (ref 70–99)
Glucose-Capillary: 135 mg/dL — ABNORMAL HIGH (ref 70–99)
Glucose-Capillary: 222 mg/dL — ABNORMAL HIGH (ref 70–99)

## 2013-12-18 LAB — LEGIONELLA ANTIGEN, URINE: Legionella Antigen, Urine: NEGATIVE

## 2013-12-18 LAB — PRO B NATRIURETIC PEPTIDE: PRO B NATRI PEPTIDE: 3688 pg/mL — AB (ref 0–450)

## 2013-12-18 MED ORDER — FUROSEMIDE 40 MG PO TABS
40.0000 mg | ORAL_TABLET | Freq: Once | ORAL | Status: AC
  Administered 2013-12-18: 40 mg via ORAL
  Filled 2013-12-18: qty 1

## 2013-12-18 MED ORDER — LISINOPRIL 20 MG PO TABS
20.0000 mg | ORAL_TABLET | Freq: Every day | ORAL | Status: DC
  Administered 2013-12-19: 20 mg via ORAL
  Filled 2013-12-18: qty 1

## 2013-12-18 MED ORDER — CARVEDILOL 3.125 MG PO TABS
3.1250 mg | ORAL_TABLET | Freq: Two times a day (BID) | ORAL | Status: DC
  Administered 2013-12-18 – 2013-12-19 (×2): 3.125 mg via ORAL
  Filled 2013-12-18 (×4): qty 1

## 2013-12-18 MED ORDER — CARVEDILOL 3.125 MG PO TABS: 3.1250 mg | ORAL_TABLET | Freq: Two times a day (BID) | ORAL | Status: DC

## 2013-12-18 NOTE — Progress Notes (Signed)
Client has hearing aids x2 in a dark red box with snap.  Client put the right hearing aid in.

## 2013-12-18 NOTE — Progress Notes (Signed)
Patient Name: Omar Walker Date of Encounter: 12/18/2013  Principal Problem:   Acute on chronic systolic CHF (congestive heart failure) Active Problems:   PE (pulmonary embolism)   H/O cardiac arrest   H/O aorto-femoral bypass   Coronary artery disease   Diabetes mellitus   Gout   Decreased cardiac ejection fraction   Aortic valvular disease   Elevated troponin level   NSTEMI (non-ST elevated myocardial infarction)   Chest pain    Patient Profile: 80 yo male w/ hx NSTEMI 2001 (PCI), NSTEMI 2011 (med rx), DM, HTN, afib, PE, PAD, on coum, hx cardiac arrest, EF ?30%, AO valve dz was admitted 04/13 w/ SOB. CHF +/- PNA. They generally spend the winter in [place] with their daughter.   SUBJECTIVE: Breathing much better, no chest pain. Family says will weigh daily and use low-Na diet at d/c.   OBJECTIVE Filed Vitals:   12/17/13 1623 12/17/13 2125 12/18/13 0150 12/18/13 0656  BP:  104/65 121/50 102/59  Pulse:  76 69 67  Temp:  98.5 F (36.9 C) 98.2 F (36.8 C) 98 F (36.7 C)  TempSrc:  Oral Oral Oral  Resp:  20 18 18   Height:      Weight:    196 lb 12.8 oz (89.268 kg)  SpO2: 93% 96% 98% 95%    Intake/Output Summary (Last 24 hours) at 12/18/13 0937 Last data filed at 12/18/13 [id number]  Gross per 24 hour  Intake    720 ml  Output   2200 ml  Net  -1480 ml   Filed Weights   12/17/13 0345 12/17/13 0707 12/18/13 0656  Weight: 207 lb (93.895 kg) 203 lb 11.2 oz (92.398 kg) 196 lb 12.8 oz (89.268 kg)    PHYSICAL EXAM General: Well developed, well nourished, male in no acute distress. Head: Normocephalic, atraumatic.  Neck: Supple without bruits, JVD 10 cm. Lungs:  Resp regular and unlabored, rales bases. Heart: RRR, S1, S2, no S3, S4, 3/6 murmur; no rub. Abdomen: Soft, non-tender, non-distended, BS + x 4.  Extremities: No clubbing, cyanosis, 1+ edema.  Neuro: Alert and oriented X 3. Moves all extremities spontaneously. Psych: Normal affect.  LABS: CBC: Recent Labs  12/17/13 0358 12/18/13 0552  WBC 10.9* 6.4  HGB 15.6 13.6  HCT 46.4 40.8  MCV 92.2 91.5  PLT 209 188   INR: Recent Labs  12/18/13 0552  INR 2.77*   Basic Metabolic Panel: Recent Labs  12/17/13 0358  NA 138  K 4.7  CL 97  CO2 22  GLUCOSE 233*  BUN 19  CREATININE 1.26  CALCIUM 11.7*   Cardiac Enzymes: Recent Labs  12/17/13 0730 12/17/13 1201 12/17/13 1905  TROPONINI <0.30 0.34* <0.30    Recent Labs  12/17/13 0406  TROPIPOC 0.00   BNP: Pro B Natriuretic peptide (BNP)  Date/Time Value Ref Range Status  12/18/2013  5:52 AM 3688.0* 0 - 450 pg/mL Final  12/17/2013  3:58 AM 2955.0* 0 - 450 pg/mL Final   Thyroid Function Tests: Recent Labs  12/17/13 0730  TSH 2.780    TELE:  SR, frequent PVCs.  Radiology/Studies: Dg Chest Port 1 View 12/17/2013   CLINICAL DATA:  Shortness of breath  EXAM: PORTABLE CHEST - 1 VIEW  COMPARISON:  CT ABD-PEL WO/W CM dated 11/02/2011  FINDINGS: There are bilateral chronic bronchitic changes. There is hazy left lower lobe airspace disease which may represent atelectasis versus pneumonia. There is no pleural effusion or pneumothorax. There is mild elevation of the  right diaphragm. There is mild cardiomegaly. The osseous structures are unremarkable.  IMPRESSION: Hazy left lower lobe airspace disease which may reflect atelectasis versus pneumonia.   Electronically Signed   By: Elige KoHetal  Patel   On: 12/17/2013 04:19     Current Medications:  . allopurinol  150 mg Oral Daily  . aspirin EC  325 mg Oral Daily  . atorvastatin  40 mg Oral q1800  . fenofibrate  54 mg Oral Daily  . furosemide  40 mg Intravenous BID  . insulin aspart  0-15 Units Subcutaneous TID WC  . insulin aspart  0-5 Units Subcutaneous QHS  . lisinopril  40 mg Oral Daily  . sodium chloride  3 mL Intravenous Q12H  . timolol  1 drop Left Eye Daily  . warfarin  5 mg Oral Once per day on Sun Mon Tue Wed Thu Fri  . Warfarin - Pharmacist Dosing Inpatient   Does not apply q1800        ASSESSMENT AND PLAN:   NSTEMI (non-ST elevated myocardial infarction) - minimal troponin elevation, likely secondary to CHF, doubt acute vessel closure. On ASA, statin, ACE.  BP borderline for adding BB, will decrease lisinopril to try to add BB, HR will tolerate.  Principal Problem:   Acute on chronic systolic CHF (congestive heart failure) - improved, wt much lower, continue IV Lasix today, possible change to PO in am. Echo pending, called them.  Will make sure records obtained from primary cardiologist in [place]. Will arrange f/u appt in [place], they will see before they go home. Need to establish cardiac care here as they routinely stay here several months during the winter. Have set up w/ L. Gerhardt NP for Dr. Mayford Knifeurner  Otherwise, per IM. Active Problems:   PE (pulmonary embolism)   H/O cardiac arrest   H/O aorto-femoral bypass   Coronary artery disease   Diabetes mellitus   Gout   Decreased cardiac ejection fraction   Aortic valvular disease   Elevated troponin level   Chest pain   Signed, Darrol JumpRhonda G Barrett , PA-C 9:37 AM 12/18/2013  Doubt acute coronary syndrome reviewed some records form [place]  His EF was been 25% with severe AS calculated AVA .7cm2 Suspect low gradient low flow  Cardiologist indicates preliminary discussion about biV pacing or TAVR Echo pending here.  Looks better Decreasing or stopping ACE with fixed AS ok  Will be going back to [place] in a couple of weeks and will need to f/u with his cardiologist then   Wendall StadePeter C Nishan

## 2013-12-18 NOTE — Progress Notes (Signed)
ANTICOAGULATION CONSULT NOTE - Follow-Up Consult  Pharmacy Consult for Coumadin  Indication: atrial fibrillation/PE hx and PVD  Allergies  Allergen Reactions  . Milk-Related Compounds     Can tolerate ice creams.     Patient Measurements: Height: 5\' 11"  (180.3 cm) Weight: 196 lb 12.8 oz (89.268 kg) IBW/kg (Calculated) : 75.3 Heparin Dosing Weight:   Vital Signs: Temp: 98 F (36.7 C) (04/14 0656) Temp src: Oral (04/14 0656) BP: 102/59 mmHg (04/14 0656) Pulse Rate: 67 (04/14 0656)  Labs:  Recent Labs  12/17/13 0358 12/17/13 0730 12/17/13 1201 12/17/13 1905 12/18/13 0552  HGB 15.6  --   --   --  13.6  HCT 46.4  --   --   --  40.8  PLT 209  --   --   --  188  LABPROT 24.0*  --   --   --  28.3*  INR 2.23*  --   --   --  2.77*  CREATININE 1.26  --   --   --   --   TROPONINI  --  <0.30 0.34* <0.30  --     Estimated Creatinine Clearance: 46.5 ml/min (by C-G formula based on Cr of 1.26).   Medical History: Past Medical History  Diagnosis Date  . Coronary artery disease   . Stented coronary artery   . Hypertension   . Aortic valve insufficiency   . CHF (congestive heart failure)   . AF (atrial fibrillation)   . Embolism and thrombosis   . PE (pulmonary embolism)     hx  . H/O cardiac arrest     "3-4 times at least over various years" (12/17/2013)  . H/O aorto-femoral bypass 12/17/2013  . Gout     "a few times"  . Decreased cardiac ejection fraction 12/17/2013  . Aortic valvular disease 12/17/2013  . Elevated troponin level 12/17/2013  . High cholesterol   . DVT, lower extremity   . Type II diabetes mellitus   . History of blood transfusion     "think related to aorto operation"  . GERD (gastroesophageal reflux disease)   . H/O hiatal hernia   . NSTEMI (non-ST elevated myocardial infarction) 2001; 2013    hx/notes 12/17/2013    Medications:  Prescriptions prior to admission  Medication Sig Dispense Refill  . allopurinol (ZYLOPRIM) 300 MG tablet Take 150  mg by mouth daily.      Marland Kitchen. aspirin EC 81 MG tablet Take 81 mg by mouth daily.      . Cholecalciferol (VITAMIN D PO) Take 2 tablets by mouth daily.      . fenofibrate 54 MG tablet Take 54 mg by mouth daily.      Marland Kitchen. glimepiride (AMARYL) 1 MG tablet Take 1 mg by mouth 2 (two) times daily.      Marland Kitchen. lisinopril (PRINIVIL,ZESTRIL) 40 MG tablet Take 40 mg by mouth daily.      . simvastatin (ZOCOR) 80 MG tablet Take 80 mg by mouth every evening.      . sitaGLIPtin (JANUVIA) 50 MG tablet Take 50 mg by mouth daily.      . timolol (TIMOPTIC) 0.5 % ophthalmic solution Place 1 drop into the left eye daily.      Marland Kitchen. warfarin (COUMADIN) 5 MG tablet Take 5 mg by mouth See admin instructions. Take every day except Saturday        Assessment: 80 y.o. male with Past medical history of coronary artery disease status post PCI, diabetes mellitus, hypertension,  aortic wall disease, low ejection fraction, atrial fibrillation, pulmonary embolism, peripheral vascular disease with history of aortofemoral bypass, history of cardiac arrest.   The patient presented with complaints of shortness of breath that has been progressively worsening over last 2 days   -- acute on chronic CHF, hx of afib and PE and Aortic insuf (native valve) coumadin to continue at home dose of 5 mg daily except no Coumadin on Saturdays.  Current INR is 2.77.  CBC WNL  Goal of Therapy:  INR 2-3 Monitor platelets by anticoagulation protocol: Yes   Plan:  Continue coumadin 5 mg daily except Saturday.   Daily INR/PT for now.  Tad MooreJessica Lyla Jasek, Pharm D, BCPS  Clinical Pharmacist Pager [phone]  12/18/2013 10:37 AM

## 2013-12-18 NOTE — Progress Notes (Signed)
  Echocardiogram 2D Echocardiogram has been performed.  Omar Walker [name] 12/18/2013, 11:24 AM 

## 2013-12-18 NOTE — Plan of Care (Signed)
Problem: Phase II Progression Outcomes Goal: Walk in hall or up in chair TID Outcome: Progressing Up in chair.

## 2013-12-18 NOTE — Progress Notes (Signed)
Introduced self to pt. A&0x4.  Up in chair.  Wife at pt's side.  Denies any pain or discomfort.  Call bell at reach.  Instructed to call for assistance.  Verbalized understanding.  Will continue to monitor.  Basil Buffin,RN.

## 2013-12-18 NOTE — Progress Notes (Signed)
The patient did not have any complaints of pain overnight and his VS remained stable.  He states that he is feeling better this morning.

## 2013-12-18 NOTE — Discharge Summary (Signed)
Physician Discharge Summary  Omar Walker ZOX:[id number] DOB: 1928/08/07 DOA: 12/17/2013  PCP: No PCP Per Patient  Admit date: 12/17/2013 Discharge date: 12/18/2013  Time spent: >45 minutes  NOTE - THIS IS A PRELIMINARY D/C SUMMARY- MEDICATIONS ARE NOT YET COMPLETE  Recommendations for Outpatient Follow-up:  1. See PCP in 2 wks  Discharge Diagnoses:  Principal Problem:   Acute on chronic systolic CHF (congestive heart failure) Active Problems:   PE (pulmonary embolism)   H/O cardiac arrest   H/O aorto-femoral bypass   Coronary artery disease   Diabetes mellitus   Gout   Decreased cardiac ejection fraction   Aortic valvular disease   Elevated troponin level   NSTEMI (non-ST elevated myocardial infarction)   Chest pain   Discharge Condition: stable  Diet recommendation: low sodium, heart healthy  Filed Weights   12/17/13 0345 12/17/13 0707 12/18/13 0656  Weight: 93.895 kg (207 lb) 92.398 kg (203 lb 11.2 oz) 89.268 kg (196 lb 12.8 oz)    History of present illness:  Omar Walker is a 80 y.o. male with Past medical history of coronary artery disease status post PCI, diabetes mellitus, hypertension, aortic wall disease, low ejection fraction, atrial fibrillation, pulmonary embolism, peripheral vascular disease with history of aortofemoral bypass, history of cardiac arrest,  The patient presented with complaints of shortness of breath that has been progressively worsening over last 2 days. As per the family he does have baseline shortness of breath and he gets short of breath walking up to his mailbox. Since last 2 days he has been having progressively worsening shortness of breath with minimal exertion and since today he has been short of breath even at rest and has been in his recliner throughout the day. He denies any complaint of chest pain chest tightness. He does mentions that when he takes a deep breath he feels that there is restriction of air. He denies any nausea or  vomiting no acid reflux no dizziness no lightheadedness no syncopal events no diarrhea no constipation no burning urination or decreased urination no active bleeding.  Family mentions his ejection fraction is around 30 person and he has aortic wall disease for which his cardiologist has recommended guarded prognosis mentioning eventually he would have difficulty with breathing due to worsening fluid status and patient has refused any type of surgery for that particular situation.  Since last December he has been here in [place] otherwise he lives up in [place] and has his primary physician as well as primary cardiologist in [place].  Patient has been treated with at least 3 rounds of antibiotics amoxicillin, levofloxacin, azithromycin for subacute bronchitis as seen primary care physician as well as ENT here. His chest x-ray on March 13 15 was negative for any acute abnormality as per family. He also had a CT maxilla sinus which was negative for sinusitis.  One week ago patient had a sudden episode of aspiration and was coughing and choking which lasted for a few minutes after which a she was tired for the whole day. He did not have any further aspiration events or progressively worsening shortness of breath the next day.  The patient is coming from home. And at her baseline Independent for most of his ADL.   Hospital Course:   CHF acute on chronic systolic - EF to 81/19%- patient was diuresed aggressively and pulm edema has now resolved - he will be placed on low dose Lasix at 20 mg daily to prevent fluid overload again-  he is aware he needs to monitor his weight daily and call his PCP for fluid weight gain - will will see his cardiologist in 2-3 wks upon returning to [place]  Elevated Troponin - this was mild and there was not chest pain associated with it - Cardiology feels it was a type 2 MI in relation to the patient's heart failure.   Mod Ao stenosis - he has declined surgery for  this  ARF - due to diuresis - d/w cardiology- have stopped IV Lasix and will give 40 PO in AM- follow creatinine.   DM - stable  H/o PE in 2013 - cont on Coumadin- therapeutic level    Procedures:  ECHO 4/14 Study Conclusions  - Left ventricle: The cavity size was moderately dilated. Wall thickness was increased in a pattern of mild LVH. Systolic function was severely reduced. The estimated ejection fraction was in the range of 15% to 20%. Severe hypokinesis of the entire myocardium. - Aortic valve: Moderately calcified annulus. Moderately thickened, severely calcified leaflets. There was mild stenosis. Mild regurgitation. Valve area: 0.96cm^2(VTI). Valve area: 0.91cm^2 (Vmax). - Left atrium: The atrium was mildly dilated. - Right ventricle: The cavity size was mildly dilated. Systolic function was mildly reduced. - Tricuspid valve: Mild-moderate regurgitation.    Consultations:  Cardiology   Discharge Exam: Filed Vitals:   12/18/13 1440  BP: 109/55  Pulse: 67  Temp: 98 F (36.7 C)  Resp: 18    General: AAO x 3 Cardiovascular: RRR, no murmurs Respiratory: CTA b/l   Discharge Instructions You were cared for by a hospitalist during your hospital stay. If you have any questions about your discharge medications or the care you received while you were in the hospital after you are discharged, you can call the unit and asked to speak with the hospitalist on call if the hospitalist that took care of you is not available. Once you are discharged, your primary care physician will handle any further medical issues. Please note that NO REFILLS for any discharge medications will be authorized once you are discharged, as it is imperative that you return to your primary care physician (or establish a relationship with a primary care physician if you do not have one) for your aftercare needs so that they can reassess your need for medications and monitor your lab values.        Future Appointments Provider Department Dept Phone   01/02/2014 8:30 AM Omar MacadamiaLori C Gerhardt, NP [facility] [address] Office [phone]       Medication List    TAKE these medications       allopurinol 300 MG tablet  Commonly known as:  ZYLOPRIM  Take 150 mg by mouth daily.     aspirin EC 81 MG tablet  Take 81 mg by mouth daily.     carvedilol 3.125 MG tablet  Commonly known as:  COREG  Take 1 tablet (3.125 mg total) by mouth 2 (two) times daily with a meal.     fenofibrate 54 MG tablet  Take 54 mg by mouth daily.     glimepiride 1 MG tablet  Commonly known as:  AMARYL  Take 1 mg by mouth 2 (two) times daily.     simvastatin 80 MG tablet  Commonly known as:  ZOCOR  Take 80 mg by mouth every evening.     sitaGLIPtin 50 MG tablet  Commonly known as:  JANUVIA  Take 50 mg by mouth daily.     timolol 0.5 %  ophthalmic solution  Commonly known as:  TIMOPTIC  Place 1 drop into the left eye daily.     VITAMIN D PO  Take 2 tablets by mouth daily.     warfarin 5 MG tablet  Commonly known as:  COUMADIN  Take 5 mg by mouth See admin instructions. Take every day except Saturday      ASK your doctor about these medications       lisinopril 40 MG tablet  Commonly known as:  PRINIVIL,ZESTRIL  Take 40 mg by mouth daily.       Allergies  Allergen Reactions  . Milk-Related Compounds     Can tolerate ice creams.    Follow-up Information   Follow up with Omar FredricksonLORI GERHARDT, NP On 01/02/2014. (See for Dr. Mayford Knifeurner at 8:30 am)    Specialty:  Nurse Practitioner   Contact information:   [address] [place] [address] [phone]        The results of significant diagnostics from this hospitalization (including imaging, microbiology, ancillary and laboratory) are listed below for reference.    Significant Diagnostic Studies: Dg Chest Port 1 View  12/17/2013   CLINICAL DATA:  Shortness of breath  EXAM: PORTABLE CHEST - 1 VIEW  COMPARISON:  CT ABD-PEL  WO/W CM dated 11/02/2011  FINDINGS: There are bilateral chronic bronchitic changes. There is hazy left lower lobe airspace disease which may represent atelectasis versus pneumonia. There is no pleural effusion or pneumothorax. There is mild elevation of the right diaphragm. There is mild cardiomegaly. The osseous structures are unremarkable.  IMPRESSION: Hazy left lower lobe airspace disease which may reflect atelectasis versus pneumonia.   Electronically Signed   By: Elige KoHetal  Patel   On: 12/17/2013 04:19    Microbiology: No results found for this or any previous visit (from the past 240 hour(s)).   Labs: Basic Metabolic Panel:  Recent Labs Lab 12/17/13 0358 12/18/13 1336  NA 138 139  K 4.7 3.8  CL 97 96  CO2 22 28  GLUCOSE 233* 157*  BUN 19 27*  CREATININE 1.26 1.70*  CALCIUM 11.7* 10.6*   Liver Function Tests: No results found for this basename: AST, ALT, ALKPHOS, BILITOT, PROT, ALBUMIN,  in the last 168 hours No results found for this basename: LIPASE, AMYLASE,  in the last 168 hours No results found for this basename: AMMONIA,  in the last 168 hours CBC:  Recent Labs Lab 12/17/13 0358 12/18/13 0552  WBC 10.9* 6.4  HGB 15.6 13.6  HCT 46.4 40.8  MCV 92.2 91.5  PLT 209 188   Cardiac Enzymes:  Recent Labs Lab 12/17/13 0730 12/17/13 1201 12/17/13 1905  TROPONINI <0.30 0.34* <0.30   BNP: BNP (last 3 results)  Recent Labs  12/17/13 0358 12/18/13 0552  PROBNP 2955.0* 3688.0*   CBG:  Recent Labs Lab 12/17/13 1136 12/17/13 1641 12/17/13 2122 12/18/13 0622 12/18/13 1136  GLUCAP 215* 175* 163* 122* 135*       Signed:  Brandee Markin  [facility] 12/18/2013, 4:38 PM

## 2013-12-19 LAB — BASIC METABOLIC PANEL
BUN: 31 mg/dL — AB (ref 6–23)
CHLORIDE: 94 meq/L — AB (ref 96–112)
CO2: 27 mEq/L (ref 19–32)
Calcium: 10.8 mg/dL — ABNORMAL HIGH (ref 8.4–10.5)
Creatinine, Ser: 1.68 mg/dL — ABNORMAL HIGH (ref 0.50–1.35)
GFR calc Af Amer: 41 mL/min — ABNORMAL LOW (ref >90)
GFR, EST NON AFRICAN AMERICAN: 36 mL/min — AB (ref >90)
GLUCOSE: 178 mg/dL — AB (ref 70–99)
Potassium: 4.3 mEq/L (ref 3.7–5.3)
SODIUM: 138 meq/L (ref 137–147)

## 2013-12-19 LAB — CBC
HCT: 43.8 % (ref 39.0–52.0)
Hemoglobin: 14.9 g/dL (ref 13.0–17.0)
MCH: 31 pg (ref 26.0–34.0)
MCHC: 34 g/dL (ref 30.0–36.0)
MCV: 91.1 fL (ref 78.0–100.0)
PLATELETS: 204 10*3/uL (ref 150–400)
RBC: 4.81 MIL/uL (ref 4.22–5.81)
RDW: 13.9 % (ref 11.5–15.5)
WBC: 7.1 10*3/uL (ref 4.0–10.5)

## 2013-12-19 LAB — GLUCOSE, CAPILLARY
Glucose-Capillary: 196 mg/dL — ABNORMAL HIGH (ref 70–99)
Glucose-Capillary: 241 mg/dL — ABNORMAL HIGH (ref 70–99)

## 2013-12-19 LAB — PROTIME-INR
INR: 2.62 — ABNORMAL HIGH (ref 0.00–1.49)
PROTHROMBIN TIME: 27.1 s — AB (ref 11.6–15.2)

## 2013-12-19 MED ORDER — CARVEDILOL 3.125 MG PO TABS: 3.1250 mg | ORAL_TABLET | Freq: Two times a day (BID) | ORAL | Status: DC

## 2013-12-19 MED ORDER — FUROSEMIDE 40 MG PO TABS: 40.0000 mg | ORAL_TABLET | Freq: Every day | ORAL | Status: DC

## 2013-12-19 MED ORDER — LISINOPRIL 40 MG PO TABS: 20.0000 mg | ORAL_TABLET | Freq: Every day | ORAL | Status: DC

## 2013-12-19 NOTE — Discharge Summary (Addendum)
Physician Discharge Summary  Omar Walker ZOX:[id number] DOB: [birth year] DOA: 12/17/2013  PCP: Herb Grays, MD  Admit date: 12/17/2013 Discharge date: 12/19/2013  Time spent: >45 minutes   Recommendations for Outpatient Follow-up:  1. Dr. Herb Grays, PCP in 5 days to be seen with repeat labs (CBC, PT, INR and BMP) 2. Norma Fredrickson, Cardiology on 01/02/2014 at 8;30 AM.  3. Recommend followup chest x-ray as outpatient in a couple of weeks-to ensure resolution of hazy left lower lobe airspace disease. This could have been due to CHF with no clinical features suggestive of pneumonia.  Discharge Diagnoses:  Principal Problem:   Acute on chronic systolic CHF (congestive heart failure) Active Problems:   PE (pulmonary embolism)   H/O cardiac arrest   H/O aorto-femoral bypass   Coronary artery disease   Diabetes mellitus   Gout   Decreased cardiac ejection fraction   Aortic valvular disease   Elevated troponin level   NSTEMI (non-ST elevated myocardial infarction)   Chest pain   Discharge Condition: stable  Diet recommendation: low sodium, heart healthy & diabetic diet  Filed Weights   12/17/13 0707 12/18/13 0656 12/19/13 0429  Weight: 92.398 kg (203 lb 11.2 oz) 89.268 kg (196 lb 12.8 oz) 88.497 kg (195 lb 1.6 oz)    History of present illness:  Omar Walker is a 80 y.o. male with Past medical history of coronary artery disease status post PCI, diabetes mellitus, hypertension, aortic wall disease, low ejection fraction, atrial fibrillation, pulmonary embolism, peripheral vascular disease with history of aortofemoral bypass, history of cardiac arrest,  The patient presented with complaints of shortness of breath that has been progressively worsening over last 2 days. As per the family he does have baseline shortness of breath and he gets short of breath walking up to his mailbox. Since last 2 days he has been having progressively worsening shortness of breath with minimal exertion  and since today he has been short of breath even at rest and has been in his recliner throughout the day. He denies any complaint of chest pain chest tightness. He does mentions that when he takes a deep breath he feels that there is restriction of air. He denies any nausea or vomiting no acid reflux no dizziness no lightheadedness no syncopal events no diarrhea no constipation no burning urination or decreased urination no active bleeding.  Family mentions his ejection fraction is around 30 % and he has aortic wall disease for which his cardiologist has recommended guarded prognosis mentioning eventually he would have difficulty with breathing due to worsening fluid status and patient has refused any type of surgery for that particular situation.  Since last December he has been here in [place] otherwise he lives up in [place] and has his primary physician as well as primary cardiologist in [place].  Patient has been treated with at least 3 rounds of antibiotics amoxicillin, levofloxacin, azithromycin for subacute bronchitis as seen primary care physician as well as ENT here. His chest x-ray on March 13,2015 was negative for any acute abnormality as per family. He also had a CT maxilla sinus which was negative for sinusitis.  One week ago patient had a sudden episode of aspiration and was coughing and choking which lasted for a few minutes after which a she was tired for the whole day. He did not have any further aspiration events or progressively worsening shortness of breath the next day.  The patient is coming from home. And at her baseline Independent  for most of his ADL.   Hospital Course:   CHF acute on chronic systolic - EF to 16/10%15/20%- patient was diuresed aggressively and pulm edema has now resolved - Cardiology consulted and assisted with management. Patient has improved significantly. Discussed with Dr. Eden EmmsNishan, cardiology who recommends Lasix 40 mg daily and an additional 40 mg if weight  greater than 3 pounds. He has discussed this with family and then can handle. Cardiology has cleared patient for discharge home and have arranged for followup appointment on 4/29. He will need [place] TAVR evaluation which would have to be transaortic or transapical approach given PVD.  Elevated Troponin - this was mild and there was not chest pain associated with it - Cardiology feels it was a type 2 NSTEMI in relation to the patient's heart failure.   Mod Aortic stenosis - he has declined surgery for this. Management as indicated above.  ARF - due to diuresis - Patient's creatinine is in the 1.6-1.7 range. Admitting creatinine 1.26. Baseline creatinine unknown. We may have to tolerate slightly elevated creatinine in the context of diuretic needs. Follow BMP as outpatient. Lisinopril dose reduced to allow beta blocker use.  DM - Continue home regimen but may have to be adjusted if creatinine worsens.  H/o PE in 2013 - cont on Coumadin- therapeutic level    Procedures:  ECHO 4/14 Study Conclusions  - Left ventricle: The cavity size was moderately dilated. Wall thickness was increased in a pattern of mild LVH. Systolic function was severely reduced. The estimated ejection fraction was in the range of 15% to 20%. Severe hypokinesis of the entire myocardium. - Aortic valve: Moderately calcified annulus. Moderately thickened, severely calcified leaflets. There was mild stenosis. Mild regurgitation. Valve area: 0.96cm^2(VTI). Valve area: 0.91cm^2 (Vmax). - Left atrium: The atrium was mildly dilated. - Right ventricle: The cavity size was mildly dilated. Systolic function was mildly reduced. - Tricuspid valve: Mild-moderate regurgitation.    Consultations:  Cardiology   Discharge Exam: Filed Vitals:   12/19/13 1101  BP: 109/50  Pulse:   Temp:   Resp:    temperature 98.36F, pulse 73 per minute, respiration 18 per minute, blood pressure 109/50 and  oxygen saturation 96%  General: AAO x 3. Sitting comfortably in chair without distress. Cardiovascular: RRR, no murmurs. No JVD. Trace bilateral ankle edema. Telemetry: Sinus rhythm with occasional PVCs. Systolic ejection murmur 2 x 6 at apex. Respiratory: CTA b/l . No increased work of breathing. CNS: No focal deficits  Discharge Instructions You were cared for by a hospitalist during your hospital stay. If you have any questions about your discharge medications or the care you received while you were in the hospital after you are discharged, you can call the unit and asked to speak with the hospitalist on call if the hospitalist that took care of you is not available. Once you are discharged, your primary care physician will handle any further medical issues. Please note that NO REFILLS for any discharge medications will be authorized once you are discharged, as it is imperative that you return to your primary care physician (or establish a relationship with a primary care physician if you do not have one) for your aftercare needs so that they can reassess your need for medications and monitor your lab values.      Discharge Orders   Future Appointments Provider Department Dept Phone   01/02/2014 8:30 AM Rosalio MacadamiaLori C Gerhardt, NP [facility] [address] Office [phone]  Future Orders Complete By Expires   (HEART FAILURE PATIENTS) Call MD:  Anytime you have any of the following symptoms: 1) 3 pound weight gain in 24 hours or 5 pounds in 1 week 2) shortness of breath, with or without a dry hacking cough 3) swelling in the hands, feet or stomach 4) if you have to sleep on extra pillows at night in order to breathe.  As directed    Call MD for:  difficulty breathing, headache or visual disturbances  As directed    Diet - low sodium heart healthy  As directed    Diet Carb Modified  As directed    Increase activity slowly  As directed        Medication List         allopurinol 300 MG tablet   Commonly known as:  ZYLOPRIM  Take 150 mg by mouth daily.     aspirin EC 81 MG tablet  Take 81 mg by mouth daily.     carvedilol 3.125 MG tablet  Commonly known as:  COREG  Take 1 tablet (3.125 mg total) by mouth 2 (two) times daily with a meal.     fenofibrate 54 MG tablet  Take 54 mg by mouth daily.     furosemide 40 MG tablet  Commonly known as:  LASIX  Take 1 tablet (40 mg total) by mouth daily. May use additional 1 tablet if weight greater than 3 pounds     glimepiride 1 MG tablet  Commonly known as:  AMARYL  Take 1 mg by mouth 2 (two) times daily.     lisinopril 40 MG tablet  Commonly known as:  PRINIVIL,ZESTRIL  Take 0.5 tablets (20 mg total) by mouth daily.     simvastatin 80 MG tablet  Commonly known as:  ZOCOR  Take 80 mg by mouth every evening.     sitaGLIPtin 50 MG tablet  Commonly known as:  JANUVIA  Take 50 mg by mouth daily.     timolol 0.5 % ophthalmic solution  Commonly known as:  TIMOPTIC  Place 1 drop into the left eye daily.     VITAMIN D PO  Take 2 tablets by mouth daily.     warfarin 5 MG tablet  Commonly known as:  COUMADIN  Take 5 mg by mouth See admin instructions. Take every day except Saturday       Allergies  Allergen Reactions  . Milk-Related Compounds     Can tolerate ice creams.    Follow-up Information   Follow up with Norma Fredrickson, NP On 01/02/2014. (See for Dr. Mayford Knife at 8:30 am. To be seen with repeat labs (BMP, PT & INR).)    Specialty:  Nurse Practitioner   Contact information:   [address] [place] [address] [phone]        The results of significant diagnostics from this hospitalization (including imaging, microbiology, ancillary and laboratory) are listed below for reference.    Significant Diagnostic Studies: Dg Chest Port 1 View  12/17/2013   CLINICAL DATA:  Shortness of breath  EXAM: PORTABLE CHEST - 1 VIEW  COMPARISON:  CT ABD-PEL WO/W CM dated 11/02/2011  FINDINGS: There are  bilateral chronic bronchitic changes. There is hazy left lower lobe airspace disease which may represent atelectasis versus pneumonia. There is no pleural effusion or pneumothorax. There is mild elevation of the right diaphragm. There is mild cardiomegaly. The osseous structures are unremarkable.  IMPRESSION: Hazy left lower lobe airspace  disease which may reflect atelectasis versus pneumonia.   Electronically Signed   By: Elige KoHetal  Patel   On: 12/17/2013 04:19    Microbiology: No results found for this or any previous visit (from the past 240 hour(s)).   Labs: Basic Metabolic Panel:  Recent Labs Lab 12/17/13 0358 12/18/13 1336 12/19/13 0400  NA 138 139 138  K 4.7 3.8 4.3  CL 97 96 94*  CO2 22 28 27   GLUCOSE 233* 157* 178*  BUN 19 27* 31*  CREATININE 1.26 1.70* 1.68*  CALCIUM 11.7* 10.6* 10.8*   Liver Function Tests: No results found for this basename: AST, ALT, ALKPHOS, BILITOT, PROT, ALBUMIN,  in the last 168 hours No results found for this basename: LIPASE, AMYLASE,  in the last 168 hours No results found for this basename: AMMONIA,  in the last 168 hours CBC:  Recent Labs Lab 12/17/13 0358 12/18/13 0552 12/19/13 0400  WBC 10.9* 6.4 7.1  HGB 15.6 13.6 14.9  HCT 46.4 40.8 43.8  MCV 92.2 91.5 91.1  PLT 209 188 204   Cardiac Enzymes:  Recent Labs Lab 12/17/13 0730 12/17/13 1201 12/17/13 1905  TROPONINI <0.30 0.34* <0.30   BNP: BNP (last 3 results)  Recent Labs  12/17/13 0358 12/18/13 0552  PROBNP 2955.0* 3688.0*   CBG:  Recent Labs Lab 12/18/13 1136 12/18/13 1650 12/18/13 2055 12/19/13 0653 12/19/13 1054  GLUCAP 135* 222* 185* 196* 241*   Influenza panel PCR: Negative Urine Legionella and streptococcal antigen: Negative TSH: 2.780    Signed: Elease EtienneAnand D Hongalgi, MD, FACP, FHM. [facility] Pager [phone]  If 7PM-7AM, please contact night-coverage [email or web address] Password [email or web address] 12/19/2013, 1:59 PM

## 2013-12-19 NOTE — Progress Notes (Signed)
Patient ID: Omar EdenRobert G Ingles, male   DOB: 09/27/1927, 80 y.o.   MRN: [id number]     Patient Name: Omar EdenRobert G Walker Date of Encounter: 12/19/2013  Principal Problem:   Acute on chronic systolic CHF (congestive heart failure) Active Problems:   PE (pulmonary embolism)   H/O cardiac arrest   H/O aorto-femoral bypass   Coronary artery disease   Diabetes mellitus   Gout   Decreased cardiac ejection fraction   Aortic valvular disease   Elevated troponin level   NSTEMI (non-ST elevated myocardial infarction)   Chest pain    Patient Profile: 80 yo male w/ hx NSTEMI 2001 (PCI), NSTEMI 2011 (med rx), DM, HTN, afib, PE, PAD, on coum, hx cardiac arrest, EF ?30%, AO valve dz was admitted 04/13 w/ SOB. CHF +/- PNA. They generally spend the winter in [place] with their daughter.   SUBJECTIVE: Breathing much better, no chest pain. Family says will weigh daily and use low-Na diet at d/c.   OBJECTIVE Filed Vitals:   12/18/13 1742 12/18/13 2058 12/19/13 0429 12/19/13 0651  BP: 109/58 102/60 122/48 120/56  Pulse: 72 72 75   Temp:  97.6 F (36.4 C) 98.1 F (36.7 C)   TempSrc:  Oral Oral   Resp:  18 18   Height:      Weight:   195 lb 1.6 oz (88.497 kg)   SpO2:  98% 96%     Intake/Output Summary (Last 24 hours) at 12/19/13 1100 Last data filed at 12/19/13 0429  Gross per 24 hour  Intake    483 ml  Output   1925 ml  Net  -1442 ml   Filed Weights   12/17/13 0707 12/18/13 0656 12/19/13 0429  Weight: 203 lb 11.2 oz (92.398 kg) 196 lb 12.8 oz (89.268 kg) 195 lb 1.6 oz (88.497 kg)    PHYSICAL EXAM General: Well developed, well nourished, male in no acute distress. Head: Normocephalic, atraumatic.  Neck: Supple without bruits, JVD 10 cm. Lungs:  Resp regular and unlabored, rales bases. Heart: RRR, S1, S2, no S3, S4, 3/6 murmur; no rub. Abdomen: Soft, non-tender, non-distended, BS + x 4.  Extremities: No clubbing, cyanosis, 1+ edema.  Neuro: Alert and oriented X 3. Moves all extremities  spontaneously. Psych: Normal affect.  LABS: CBC:  Recent Labs  12/18/13 0552 12/19/13 0400  WBC 6.4 7.1  HGB 13.6 14.9  HCT 40.8 43.8  MCV 91.5 91.1  PLT 188 204   INR:  Recent Labs  12/19/13 0400  INR 2.62*   Basic Metabolic Panel:  Recent Labs  78/29/5604/14/15 1336 12/19/13 0400  NA 139 138  K 3.8 4.3  CL 96 94*  CO2 28 27  GLUCOSE 157* 178*  BUN 27* 31*  CREATININE 1.70* 1.68*  CALCIUM 10.6* 10.8*   Cardiac Enzymes:  Recent Labs  12/17/13 0730 12/17/13 1201 12/17/13 1905  TROPONINI <0.30 0.34* <0.30    Recent Labs  12/17/13 0406  TROPIPOC 0.00   BNP: Pro B Natriuretic peptide (BNP)  Date/Time Value Ref Range Status  12/18/2013  5:52 AM 3688.0* 0 - 450 pg/mL Final  12/17/2013  3:58 AM 2955.0* 0 - 450 pg/mL Final   Thyroid Function Tests:  Recent Labs  12/17/13 0730  TSH 2.780    TELE:  SR, frequent PVCs.  Radiology/Studies: Dg Chest Port 1 View 12/17/2013   CLINICAL DATA:  Shortness of breath  EXAM: PORTABLE CHEST - 1 VIEW  COMPARISON:  CT ABD-PEL WO/W CM dated 11/02/2011  FINDINGS: There  are bilateral chronic bronchitic changes. There is hazy left lower lobe airspace disease which may represent atelectasis versus pneumonia. There is no pleural effusion or pneumothorax. There is mild elevation of the right diaphragm. There is mild cardiomegaly. The osseous structures are unremarkable.  IMPRESSION: Hazy left lower lobe airspace disease which may reflect atelectasis versus pneumonia.   Electronically Signed   By: Elige KoHetal  Patel   On: 12/17/2013 04:19     Current Medications:  . allopurinol  150 mg Oral Daily  . aspirin EC  325 mg Oral Daily  . atorvastatin  40 mg Oral q1800  . carvedilol  3.125 mg Oral BID WC  . fenofibrate  54 mg Oral Daily  . insulin aspart  0-15 Units Subcutaneous TID WC  . insulin aspart  0-5 Units Subcutaneous QHS  . lisinopril  20 mg Oral Daily  . sodium chloride  3 mL Intravenous Q12H  . timolol  1 drop Left Eye Daily  .  warfarin  5 mg Oral Once per day on Sun Mon Tue Wed Thu Fri  . Warfarin - Pharmacist Dosing Inpatient   Does not apply q1800      ASSESSMENT AND PLAN:   NSTEMI (non-ST elevated myocardial infarction) - minimal troponin elevation, likely secondary to CHF, doubt acute vessel closure. On ASA, statin, ACE.  BP borderline for adding BB, will decrease lisinopril to try to add BB, HR will tolerate.  Principal Problem:   Acute on chronic systolic CHF (congestive heart failure) - improved, wt much lower, continue IV Lasix today, possible change to PO in am. Echo pending, called them.  Will make sure records obtained from primary cardiologist in [place]. Will arrange f/u appt in [place], they will see before they go home. Need to establish cardiac care here as they routinely stay here several months during the winter. Have set up w/ L. Gerhardt NP for Dr. Mayford Knifeurner  Otherwise, per IM. Active Problems:   PE (pulmonary embolism)   H/O cardiac arrest   H/O aorto-femoral bypass   Coronary artery disease   Diabetes mellitus   Gout   Decreased cardiac ejection fraction   Aortic valvular disease   Elevated troponin level   Chest pain  Tolerating single PO dose lasix  Long discussion about f/u in [place] for TAVR evaluation.  Would have to be transaortic or transapical approach givne PVD Discussed sliding scale lasix for home  Family supportive and can handle  Ok to d/c home   Wendall StadePeter C Gaylia Kassel

## 2013-12-19 NOTE — Progress Notes (Signed)
Patient was interviewed and examined. Chart reviewed in detail. Patient denied dyspnea, chest pain or cough. He was anxious to go home. Physical exam as per updated DC summary. Cardiology has seen and cleared him for discharge. Discussed with Dr. Eden EmmsNishan who advised diuretic management as per DC summary. Updated the DC summary that was done by Dr. Butler Denmarkizwan on 01/17/14. Discussed with patient's daughter who indicates that patient follows up with Dr. Collins ScotlandSpear for Coumadin management. Advised her to followup with Dr. Collins ScotlandSpear in 5 days with repeat labs for monitoring of Coumadin and renal functions. She verbalized understanding.  Elease EtienneAnand D Kenny Rea, MD, FACP, [facility]. [facility] Pager [phone]  If 7PM-7AM, please contact night-coverage [email or web address] Password [email or web address] 12/19/2013, 2:02 PM

## 2013-12-19 NOTE — Plan of Care (Signed)
Problem: Phase I Progression Outcomes Goal: EF % per last Echo/documented,Core Reminder form on chart Outcome: Completed/Met Date Met:  12/19/13 EF - 15-20% as of 12/18/2013

## 2013-12-19 NOTE — Progress Notes (Addendum)
ANTICOAGULATION CONSULT NOTE - Follow-Up Consult  Pharmacy Consult for Coumadin  Indication: atrial fibrillation/PE hx and PVD  Allergies  Allergen Reactions  . Milk-Related Compounds     Can tolerate ice creams.     Patient Measurements: Height: 5\' 11"  (180.3 cm) Weight: 195 lb 1.6 oz (88.497 kg) (scale A) IBW/kg (Calculated) : 75.3 Heparin Dosing Weight:   Vital Signs: Temp: 98.1 F (36.7 C) (04/15 0429) Temp src: Oral (04/15 0429) BP: 120/56 mmHg (04/15 0651) Pulse Rate: 75 (04/15 0429)  Labs:  Recent Labs  12/17/13 0358 12/17/13 0730 12/17/13 1201 12/17/13 1905 12/18/13 0552 12/18/13 1336 12/19/13 0400  HGB 15.6  --   --   --  13.6  --  14.9  HCT 46.4  --   --   --  40.8  --  43.8  PLT 209  --   --   --  188  --  204  LABPROT 24.0*  --   --   --  28.3*  --  27.1*  INR 2.23*  --   --   --  2.77*  --  2.62*  CREATININE 1.26  --   --   --   --  1.70* 1.68*  TROPONINI  --  <0.30 0.34* <0.30  --   --   --     Estimated Creatinine Clearance: 34.9 ml/min (by C-G formula based on Cr of 1.68).   Medical History: Past Medical History  Diagnosis Date  . Coronary artery disease   . Stented coronary artery   . Hypertension   . Aortic valve insufficiency   . CHF (congestive heart failure)   . AF (atrial fibrillation)   . Embolism and thrombosis   . PE (pulmonary embolism)     hx  . H/O cardiac arrest     "3-4 times at least over various years" (12/17/2013)  . H/O aorto-femoral bypass 12/17/2013  . Gout     "a few times"  . Decreased cardiac ejection fraction 12/17/2013  . Aortic valvular disease 12/17/2013  . Elevated troponin level 12/17/2013  . High cholesterol   . DVT, lower extremity   . Type II diabetes mellitus   . History of blood transfusion     "think related to aorto operation"  . GERD (gastroesophageal reflux disease)   . H/O hiatal hernia   . NSTEMI (non-ST elevated myocardial infarction) 2001; 2013    hx/notes 12/17/2013    Medications:   Prescriptions prior to admission  Medication Sig Dispense Refill  . allopurinol (ZYLOPRIM) 300 MG tablet Take 150 mg by mouth daily.      Marland Kitchen. aspirin EC 81 MG tablet Take 81 mg by mouth daily.      . Cholecalciferol (VITAMIN D PO) Take 2 tablets by mouth daily.      . fenofibrate 54 MG tablet Take 54 mg by mouth daily.      Marland Kitchen. glimepiride (AMARYL) 1 MG tablet Take 1 mg by mouth 2 (two) times daily.      Marland Kitchen. lisinopril (PRINIVIL,ZESTRIL) 40 MG tablet Take 40 mg by mouth daily.      . simvastatin (ZOCOR) 80 MG tablet Take 80 mg by mouth every evening.      . sitaGLIPtin (JANUVIA) 50 MG tablet Take 50 mg by mouth daily.      . timolol (TIMOPTIC) 0.5 % ophthalmic solution Place 1 drop into the left eye daily.      Marland Kitchen. warfarin (COUMADIN) 5 MG tablet Take 5 mg by  mouth See admin instructions. Take every day except Saturday        Assessment: 80 y.o. male with Past medical history of coronary artery disease status post PCI, diabetes mellitus, hypertension, aortic wall disease, low ejection fraction, atrial fibrillation, pulmonary embolism, peripheral vascular disease with history of aortofemoral bypass, history of cardiac arrest.   The patient presented with complaints of shortness of breath that has been progressively worsening over last 2 days   -- acute on chronic CHF, hx of afib and PE and Aortic insuf (native valve) coumadin to continue at home dose of 5 mg daily except no Coumadin on Saturdays.  Current INR is 2.62.  CBC WNL  Spoke to patient and family, they used to see Herb Graysammy Spear to follow his INR, [facility]  Goal of Therapy:  INR 2-3 Monitor platelets by anticoagulation protocol: Yes   Plan:  Continue coumadin 5 mg daily except Saturday.   Daily INR/PT for now.  Tad MooreJessica Elihu Milstein, Pharm D, BCPS  Clinical Pharmacist Pager [phone]  12/19/2013 10:08 AM

## 2013-12-19 NOTE — Discharge Instructions (Signed)
Information on my medicine - Coumadin   (Warfarin)  This medication education was reviewed with me or my healthcare representative as part of my discharge preparation.  The pharmacist that spoke with me during my hospital stay was:  Gardner CandleJessica C Ahlijah Raia, [facility]  Why was Coumadin prescribed for you? Coumadin was prescribed for you because you have a blood clot or a medical condition that can cause an increased risk of forming blood clots. Blood clots can cause serious health problems by blocking the flow of blood to the heart, lung, or brain. Coumadin can prevent harmful blood clots from forming. As a reminder your indication for Coumadin is:   History of PE, afib.  What test will check on my response to Coumadin? While on Coumadin (warfarin) you will need to have an INR test regularly to ensure that your dose is keeping you in the desired range. The INR (international normalized ratio) number is calculated from the result of the laboratory test called prothrombin time (PT).  If an INR APPOINTMENT HAS NOT ALREADY BEEN MADE FOR YOU please schedule an appointment to have this lab work done by your health care provider within 7 days. Your INR goal is usually a number between:  2 to 3 or your provider may give you a more narrow range like 2-2.5.  Ask your health care provider during an office visit what your goal INR is.  What  do you need to  know  About  COUMADIN? Take Coumadin (warfarin) exactly as prescribed by your healthcare provider about the same time each day.  DO NOT stop taking without talking to the doctor who prescribed the medication.  Stopping without other blood clot prevention medication to take the place of Coumadin may increase your risk of developing a new clot or stroke.  Get refills before you run out.  What do you do if you miss a dose? If you miss a dose, take it as soon as you remember on the same day then continue your regularly scheduled regimen the next day.  Do not take two doses  of Coumadin at the same time.  Important Safety Information A possible side effect of Coumadin (Warfarin) is an increased risk of bleeding. You should call your healthcare provider right away if you experience any of the following:   Bleeding from an injury or your nose that does not stop.   Unusual colored urine (red or dark brown) or unusual colored stools (red or black).   Unusual bruising for unknown reasons.   A serious fall or if you hit your head (even if there is no bleeding).  Some foods or medicines interact with Coumadin (warfarin) and might alter your response to warfarin. To help avoid this:   Eat a balanced diet, maintaining a consistent amount of Vitamin K.   Notify your provider about major diet changes you plan to make.   Avoid alcohol or limit your intake to 1 drink for women and 2 drinks for men per day. (1 drink is 5 oz. wine, 12 oz. beer, or 1.5 oz. liquor.)  Make sure that ANY health care provider who prescribes medication for you knows that you are taking Coumadin (warfarin).  Also make sure the healthcare provider who is monitoring your Coumadin knows when you have started a new medication including herbals and non-prescription products.  Coumadin (Warfarin)  Major Drug Interactions  Increased Warfarin Effect Decreased Warfarin Effect  Alcohol (large quantities) Antibiotics (esp. Septra/Bactrim, Flagyl, Cipro) Amiodarone (Cordarone) Aspirin (ASA) Cimetidine (  Tagamet) Megestrol (Megace) NSAIDs (ibuprofen, naproxen, etc.) Piroxicam (Feldene) Propafenone (Rythmol SR) Propranolol (Inderal) Isoniazid (INH) Posaconazole (Noxafil) Barbiturates (Phenobarbital) Carbamazepine (Tegretol) Chlordiazepoxide (Librium) Cholestyramine (Questran) Griseofulvin Oral Contraceptives Rifampin Sucralfate (Carafate) Vitamin K   Coumadin (Warfarin) Major Herbal Interactions  Increased Warfarin Effect Decreased Warfarin Effect  Garlic Ginseng Ginkgo biloba Coenzyme  Q10 Green tea St. Johns wort    Coumadin (Warfarin) FOOD Interactions  Eat a consistent number of servings per week of foods HIGH in Vitamin K (1 serving =  cup)  Collards (cooked, or boiled & drained) Kale (cooked, or boiled & drained) Mustard greens (cooked, or boiled & drained) Parsley *serving size only =  cup Spinach (cooked, or boiled & drained) Swiss chard (cooked, or boiled & drained) Turnip greens (cooked, or boiled & drained)  Eat a consistent number of servings per week of foods MEDIUM-HIGH in Vitamin K (1 serving = 1 cup)  Asparagus (cooked, or boiled & drained) Broccoli (cooked, boiled & drained, or raw & chopped) Brussel sprouts (cooked, or boiled & drained) *serving size only =  cup Lettuce, raw (green leaf, endive, romaine) Spinach, raw Turnip greens, raw & chopped   These websites have more information on Coumadin (warfarin):  FailFactory.se; VeganReport.com.au;

## 2013-12-31 ENCOUNTER — Other Ambulatory Visit (HOSPITAL_COMMUNITY): Payer: Self-pay | Admitting: Internal Medicine

## 2014-01-02 ENCOUNTER — Encounter: Payer: Self-pay | Admitting: Nurse Practitioner

## 2014-01-02 ENCOUNTER — Ambulatory Visit (INDEPENDENT_AMBULATORY_CARE_PROVIDER_SITE_OTHER): Payer: Medicare Other | Admitting: Nurse Practitioner

## 2014-01-02 VITALS — BP 100/70 | HR 68 | Ht 70.5 in | Wt 191.8 lb

## 2014-01-02 DIAGNOSIS — N189 Chronic kidney disease, unspecified: Secondary | ICD-10-CM

## 2014-01-02 DIAGNOSIS — R0609 Other forms of dyspnea: Secondary | ICD-10-CM

## 2014-01-02 DIAGNOSIS — I259 Chronic ischemic heart disease, unspecified: Secondary | ICD-10-CM

## 2014-01-02 DIAGNOSIS — R0989 Other specified symptoms and signs involving the circulatory and respiratory systems: Secondary | ICD-10-CM

## 2014-01-02 DIAGNOSIS — R0600 Dyspnea, unspecified: Secondary | ICD-10-CM

## 2014-01-02 DIAGNOSIS — I2510 Atherosclerotic heart disease of native coronary artery without angina pectoris: Secondary | ICD-10-CM

## 2014-01-02 DIAGNOSIS — I359 Nonrheumatic aortic valve disorder, unspecified: Secondary | ICD-10-CM

## 2014-01-02 DIAGNOSIS — I5022 Chronic systolic (congestive) heart failure: Secondary | ICD-10-CM

## 2014-01-02 LAB — CBC
HCT: 45.3 % (ref 39.0–52.0)
Hemoglobin: 14.9 g/dL (ref 13.0–17.0)
MCHC: 33 g/dL (ref 30.0–36.0)
MCV: 91.5 fl (ref 78.0–100.0)
Platelets: 219 10*3/uL (ref 150.0–400.0)
RBC: 4.95 Mil/uL (ref 4.22–5.81)
RDW: 14.4 % (ref 11.5–14.6)
WBC: 7.2 10*3/uL (ref 4.5–10.5)

## 2014-01-02 LAB — BASIC METABOLIC PANEL
BUN: 54 mg/dL — ABNORMAL HIGH (ref 6–23)
CO2: 29 mEq/L (ref 19–32)
Calcium: 10.4 mg/dL (ref 8.4–10.5)
Chloride: 99 mEq/L (ref 96–112)
Creatinine, Ser: 2.2 mg/dL — ABNORMAL HIGH (ref 0.4–1.5)
GFR: 29.98 mL/min — ABNORMAL LOW (ref >60.00)
Glucose, Bld: 196 mg/dL — ABNORMAL HIGH (ref 70–99)
Potassium: 4.7 mEq/L (ref 3.5–5.1)
Sodium: 137 mEq/L (ref 135–145)

## 2014-01-02 LAB — BRAIN NATRIURETIC PEPTIDE: Pro B Natriuretic peptide (BNP): 368 pg/mL — ABNORMAL HIGH (ref 0.0–100.0)

## 2014-01-02 NOTE — Patient Instructions (Addendum)
We need to recheck lab today  We will refer you to the TAVR clinic evaluation  We need to get a signed release for your records from [place]  We will get a dobutamine echocardiogram to look further at your aortic valve - soon  See Dr. Mayford Knifeurner back in a month  Continue with your current medicines.  Weigh yourself each morning and record.  Take extra dose of diuretic for weight gain of 3 pounds in 24 hours.   Limit sodium intake. Goal is to have less than 2000 mg (2gm) of salt per day.  Call the [facility] office at [phone] if you have any questions, problems or concerns.

## 2014-01-02 NOTE — Progress Notes (Addendum)
Omar Walker Date of Birth: 06/25/1928 Medical Record #[id number]  History of Present Illness: Omar Walker is seen back today for a post hospital visit. Seen for Dr. Mayford Knifeurner. He is 80 years of age. He has CAD with prior MI with PCI in 2001, repeat NSTEMI in 2013 but no cath/PCI due to multiple comorbidities - sounds like this was when he had PEs, PCI, DM, HTN, aortic valve disease, low EF, AF, PE, PVD with prior history of aortofemoral bypass and prior cardiac arrest. He is on chronic anticoagulation with coumadin. Patient apparently lives in [place] and has cardiologist there. Comes here for the winters.  Noted in the consult note from  That he has apparently has refused any type of surgery for his situation. Past EF was around 30%.   Most recently presented with shortness of breath - EF of 15 to 20% - he was diuresed. Did have elevated troponin but felt to be due to his HF exacerbation. Discharge summary mentions that the patient is to be seen in [place] for consideration of TAVR via transaortic or transapical approach given his PVD. His echo however just mentions mild AS???  Comes in today. Here with daughter and wife. Omar Walker seems to be doing ok. His breathing has improved. No chest pain. Not dizzy or lightheaded. Restricting his salt now - was not before this last admission. No swelling. He is hard of hearing. Family is quite interested in pursuing TAVR - if indicated - [place] in [place].    Current Outpatient Prescriptions  Medication Sig Dispense Refill  . allopurinol (ZYLOPRIM) 300 MG tablet Take 150 mg by mouth daily.      Marland Kitchen. aspirin EC 81 MG tablet Take 81 mg by mouth daily.      . carvedilol (COREG) 3.125 MG tablet Take 1 tablet (3.125 mg total) by mouth 2 (two) times daily with a meal.  60 tablet  0  . Cholecalciferol (VITAMIN D PO) Take 2 tablets by mouth daily.      . fenofibrate 54 MG tablet Take 54 mg by mouth daily.      . furosemide (LASIX) 40 MG tablet Take 1 tablet (40 mg  total) by mouth daily. May use additional 1 tablet if weight greater than 3 pounds  30 tablet  0  . glimepiride (AMARYL) 1 MG tablet Take 1 mg by mouth 2 (two) times daily.      Marland Kitchen. lisinopril (PRINIVIL,ZESTRIL) 40 MG tablet Take 0.5 tablets (20 mg total) by mouth daily.  30 tablet  0  . simvastatin (ZOCOR) 80 MG tablet Take 80 mg by mouth every evening.      . sitaGLIPtin (JANUVIA) 50 MG tablet Take 50 mg by mouth daily.      . timolol (TIMOPTIC) 0.5 % ophthalmic solution Place 1 drop into the left eye daily.      Marland Kitchen. warfarin (COUMADIN) 5 MG tablet Take 5 mg by mouth See admin instructions. Take every day except Saturday       No current facility-administered medications for this visit.    Allergies  Allergen Reactions  . Milk-Related Compounds     Can tolerate ice creams.     Past Medical History  Diagnosis Date  . Coronary artery disease   . Stented coronary artery   . Hypertension   . Aortic valve insufficiency   . CHF (congestive heart failure)   . AF (atrial fibrillation)   . Embolism and thrombosis   . PE (pulmonary embolism)  hx  . H/O cardiac arrest     "3-4 times at least over various years" (12/17/2013)  . H/O aorto-femoral bypass 12/17/2013  . Gout     "a few times"  . Decreased cardiac ejection fraction 12/17/2013  . Aortic valvular disease 12/17/2013  . Elevated troponin level 12/17/2013  . High cholesterol   . DVT, lower extremity   . Type II diabetes mellitus   . History of blood transfusion     "think related to aorto operation"  . GERD (gastroesophageal reflux disease)   . H/O hiatal hernia   . NSTEMI (non-ST elevated myocardial infarction) 2001; 2013    hx/notes 12/17/2013    Past Surgical History  Procedure Laterality Date  . Appendectomy    . Aorta - bilateral femoral artery bypass graft  1990's    "feet had turned black"  . Glaucoma surgery Right     "took cataract off; drilled hole in eyeball to relieve pressure"  . Tibia fracture surgery Right       "just below the knee"  . Cardiac catheterization    . Coronary angioplasty with stent placement  ?2001    "I have a couple stents in my heart"    History  Smoking status  . Never Smoker   Smokeless tobacco  . Never Used    History  Alcohol Use  . Yes    Comment: 12/17/2013 "drink 1-2 beers on occasion in the summertime"    No family history on file.  Review of Systems: The review of systems is per the HPI.  All other systems were reviewed and are negative.  Physical Exam: Ht 5' 10.5" (1.791 m)  Wt 191 lb 12.8 oz (87 kg)  BMI 27.12 kg/m2 Patient is very pleasant and in no acute distress. He is hard of hearing. Skin is warm and dry. Color is normal.  HEENT is unremarkable. Normocephalic/atraumatic. PERRL. Sclera are nonicteric. Neck is supple. No masses. No JVD. Lungs are clear. Cardiac exam shows a fairly regular rate and rhythm. Heart tones are quite distant - not a lot of murmur that I could appreciate. Abdomen is soft. Extremities are without edema. Gait and ROM are intact. No gross neurologic deficits noted.  LABORATORY DATA: PENDING  Lab Results  Component Value Date   WBC 7.1 12/19/2013   HGB 14.9 12/19/2013   HCT 43.8 12/19/2013   PLT 204 12/19/2013   GLUCOSE 178* 12/19/2013   NA 138 12/19/2013   K 4.3 12/19/2013   CL 94* 12/19/2013   CREATININE 1.68* 12/19/2013   BUN 31* 12/19/2013   CO2 27 12/19/2013   TSH 2.780 12/17/2013   INR 2.62* 12/19/2013    Echo Study Conclusions  - Left ventricle: The cavity size was moderately dilated. Wall thickness was increased in a pattern of mild LVH. Systolic function was severely reduced. The estimated ejection fraction was in the range of 15% to 20%. Severe hypokinesis of the entire myocardium. - Aortic valve: Moderately calcified annulus. Moderately thickened, severely calcified leaflets. There was mild stenosis. Mild regurgitation. Valve area: 0.96cm^2(VTI). Valve area: 0.91cm^2 (Vmax). - Left atrium: The atrium was mildly  dilated. - Right ventricle: The cavity size was mildly dilated. Systolic function was mildly reduced. - Tricuspid valve: Mild-moderate regurgitation.  Aortic valve: Moderately calcified annulus. Moderately thickened, severely calcified leaflets. Doppler: There was mild stenosis. Mild regurgitation. VTI ratio of LVOT to aortic valve: 0.21. Valve area: 0.96cm^2(VTI). Indexed valve area: 0.45cm^2/m^2 (VTI). Peak velocity ratio of LVOT to aortic valve: 0.2.  Valve area: 0.91cm^2 (Vmax). Indexed valve area: 0.43cm^2/m^2 (Vmax). Mean gradient: 14mm Hg (S). Peak gradient: 24mm Hg (S).    Assessment / Plan: 1. Acute on chronic systolic HF - looks much better and more compensated. Discussed need for daily weights, extra lasix prn weight gain and salt restriction - has good family support.   2. CAD - prior MIs and prior PCI - apparently managed medically - no current symptoms.   3. CKD - recheck lab today.   4. AS - not clear to me as to the degree that was previously mentioned due to this most recent echo findings - possible low output syndrome - discussed with Dr. Elease Hashimoto - will arrange for dobutamine echo to further define. Have referred to TCTS/TAVR as well.   5. PVD   Will get release for records (done). Arrange for dobutamine echo. Referral to TCTS/TAVR. Check labs today. See Dr. Mayford Knife in a month. May need to consider cardiac catheterization as well.   Patient is agreeable to this plan and will call if any problems develop in the interim.   Rosalio Macadamia, RN, ANP-C [facility] [address] [place], [place]  [address] [phone]

## 2014-01-03 ENCOUNTER — Other Ambulatory Visit: Payer: Self-pay | Admitting: *Deleted

## 2014-01-03 MED ORDER — FUROSEMIDE 40 MG PO TABS: 40.0000 mg | ORAL_TABLET | Freq: Every day | ORAL | Status: DC

## 2014-01-04 ENCOUNTER — Telehealth: Payer: Self-pay | Admitting: General Surgery

## 2014-01-04 NOTE — Telephone Encounter (Signed)
Message copied by Nita SellsORSON, Aura Bibby L on Fri Jan 04, 2014  3:07 PM ------      Message from: Quintella ReichertURNER, TRACI R      Created: Fri Jan 04, 2014  1:35 PM       Please have him let us know that he decides      ----- Message -----         From: Nita Sellsanielle L Annalisia Ingber, CMA         Sent: 01/04/2014  11:45 AM           To: Quintella Reichertraci R Turner, MD            Pts daughter said that they might be reconsidering doing anything and just going back to [place] and not even having the Sx. They feel it is taking to long to get things rolling.              ----- Message -----         From: Quintella Reichertraci R Turner, MD         Sent: 01/02/2014   7:47 PM           To: Nita Sellsanielle L Mearl Harewood, CMA            Please find out if patient is going back to [place] in the near future so we can decide on Cardiac followup for his AV      ----- Message -----         From: Tonny BollmanMichael Cooper, MD         Sent: 01/02/2014  12:35 PM           To: Sharyn BlitzLauren Watford Brown, RN, Quintella Reichertraci R Turner, MD, #            Probably best to do dobutamine echo first, and if severe AS confirmed I should see him because he will need cath. If he doesn't have severe AS, would leave follow-up with Dr Mayford Knifeurner. thx      ----- Message -----         From: Everlean Alstromyan Callahan Brooks, RN         Sent: 01/02/2014   9:49 AM           To: Tonny BollmanMichael Cooper, MD            Mamie NickHey,             Lori called about this patient for possible TAVR but she wants you to read her note first before we do anything.  He is from [place] but lives down here 1/2 the year with family.  They thought he had severe AS but no report has been seen so they repeated echo and it showed mild to moderate.  EF 15-20%.  She told them medical management which it sounds like needs to be done b/c there is no reason Drs. Cornelius Moraswen or Bartle need to see him right now, correct?            She is ordering dobutamine echo in the mean time.  I just wanted to double check with you to see if this is an okay plan?            Thanks, Alycia Rossettiyan                      ------

## 2014-01-04 NOTE — Telephone Encounter (Signed)
Pt is going to go home to [place]. They asked that we cancel all appointments

## 2014-01-05 ENCOUNTER — Other Ambulatory Visit (HOSPITAL_COMMUNITY): Payer: Self-pay | Admitting: Internal Medicine

## 2014-01-10 ENCOUNTER — Other Ambulatory Visit: Payer: Self-pay

## 2014-01-10 MED ORDER — SIMVASTATIN 80 MG PO TABS: 80.0000 mg | ORAL_TABLET | Freq: Every evening | ORAL | Status: DC

## 2014-01-10 MED ORDER — FUROSEMIDE 40 MG PO TABS: ORAL_TABLET | ORAL | Status: DC

## 2014-01-15 ENCOUNTER — Other Ambulatory Visit (HOSPITAL_COMMUNITY): Payer: Self-pay | Admitting: Internal Medicine

## 2014-01-16 ENCOUNTER — Telehealth: Payer: Self-pay | Admitting: Cardiology

## 2014-01-16 NOTE — Telephone Encounter (Signed)
I spoke with the pt's daughter and the pt will be leaving to go back to [place] tomorrow, this is a 2 day car ride.  The pt was scheduled for a dobutamine echo on 01/18/14 but this was cancelled by the pt because he is going back to [place].  The pt is also scheduled to follow-up with Dr Mayford Knifeurner on 01/29/14. The pt's daughter said the pt's weight on Monday was 191.8 and this was his lasix 20mg  day.  On Tuesday his weight increased to 196.8 and the pt took his normal dose of 40mg  and then an extra 40mg  due to increase of greater than 3 lbs.  The pt urinated frequently yesterday and his weight today was 196.  The pt has had a cough the past few days but no obvious SOB.  The pt did not have problems sleeping last night and no swelling.  The daughter is unsure of the pt's salt consumption because she does not know what he eats during the day while she is at work.  I will discuss this pt with Dr Mayford Knifeurner.

## 2014-01-16 NOTE — Telephone Encounter (Signed)
New problem   Pt daughter need to speak to nurse concerning pt had a 4lb gain since Monday. They are going to travel to [place] and need to speak to nurse before they go. Please call daughter.

## 2014-01-16 NOTE — Telephone Encounter (Signed)
Per Dr Mayford Knifeurner the pt needs to increase Furosemide to 40mg  twice a day for 2 days and then go back to alternating dosage. Dr Mayford Knifeurner would like the pt seen by his cardiologist in [place] ASAP. I spoke with Kendal HymenBonnie and made her aware of these recommendations and she will arrange follow-up in [place]. I will fax the pt's records to cardiologist in [place].

## 2014-01-18 ENCOUNTER — Other Ambulatory Visit (HOSPITAL_COMMUNITY): Payer: Medicare Other

## 2014-01-29 ENCOUNTER — Ambulatory Visit: Payer: Medicare Other | Admitting: Cardiology

## 2014-10-04 ENCOUNTER — Ambulatory Visit: Payer: Medicare Other | Admitting: Nurse Practitioner

## 2014-10-18 ENCOUNTER — Encounter: Payer: Self-pay | Admitting: Nurse Practitioner

## 2014-10-18 ENCOUNTER — Ambulatory Visit (INDEPENDENT_AMBULATORY_CARE_PROVIDER_SITE_OTHER): Payer: Medicare Other | Admitting: Nurse Practitioner

## 2014-10-18 VITALS — BP 128/74 | HR 95 | Ht 70.0 in | Wt 178.5 lb

## 2014-10-18 DIAGNOSIS — I5022 Chronic systolic (congestive) heart failure: Secondary | ICD-10-CM

## 2014-10-18 DIAGNOSIS — I259 Chronic ischemic heart disease, unspecified: Secondary | ICD-10-CM

## 2014-10-18 DIAGNOSIS — I359 Nonrheumatic aortic valve disorder, unspecified: Secondary | ICD-10-CM

## 2014-10-18 DIAGNOSIS — N189 Chronic kidney disease, unspecified: Secondary | ICD-10-CM

## 2014-10-18 DIAGNOSIS — I4891 Unspecified atrial fibrillation: Secondary | ICD-10-CM

## 2014-10-18 LAB — CBC
HCT: 40.1 % (ref 39.0–52.0)
Hemoglobin: 13 g/dL (ref 13.0–17.0)
MCHC: 32.4 g/dL (ref 30.0–36.0)
MCV: 88.7 fl (ref 78.0–100.0)
Platelets: 186 10*3/uL (ref 150.0–400.0)
RBC: 4.52 Mil/uL (ref 4.22–5.81)
RDW: 21.4 % — ABNORMAL HIGH (ref 11.5–15.5)
WBC: 8.2 10*3/uL (ref 4.0–10.5)

## 2014-10-18 LAB — BASIC METABOLIC PANEL
BUN: 22 mg/dL (ref 6–23)
CO2: 34 mEq/L — ABNORMAL HIGH (ref 19–32)
Calcium: 9.9 mg/dL (ref 8.4–10.5)
Chloride: 101 mEq/L (ref 96–112)
Creatinine, Ser: 1.14 mg/dL (ref 0.40–1.50)
GFR: 64.9 mL/min (ref >60.00)
Glucose, Bld: 155 mg/dL — ABNORMAL HIGH (ref 70–99)
Potassium: 4.6 mEq/L (ref 3.5–5.1)
Sodium: 139 mEq/L (ref 135–145)

## 2014-10-18 LAB — PROTIME-INR
INR: 2 ratio — ABNORMAL HIGH (ref 0.8–1.0)
Prothrombin Time: 22.3 s — ABNORMAL HIGH (ref 9.6–13.1)

## 2014-10-18 MED ORDER — CARVEDILOL 6.25 MG PO TABS: 6.2500 mg | ORAL_TABLET | Freq: Two times a day (BID) | ORAL | Status: DC

## 2014-10-18 NOTE — Progress Notes (Signed)
CARDIOLOGY OFFICE NOTE  Date:  10/18/2014    Omar Walker Date of Birth: [birth year] Medical Record #[id number]  PCP:  Omar Grays, MD  Cardiologist:  Omar Walker    Chief Complaint  Patient presents with  . Aortic Stenosis    Follow up visit - 10 month check. Seen for Dr. Mayford Walker     History of Present Illness: Omar Walker is a 81 y.o. male who presents today for a follow up visit. This is a 10 month check. Seen for Dr. Mayford Walker. He has CAD with prior MI with PCI in 2001, repeat NSTEMI in 2013 but no cath/PCI due to multiple comorbidities - sounded like this was when he had PEs. Other issues include past PCI, DM, HTN, aortic valve disease, low EF, AF, PE, PVD with prior history of aortofemoral bypass and prior cardiac arrest. He is on chronic anticoagulation with coumadin. Patient apparently lives in [place] and has cardiologist there. Comes here to [facility] for the winters. Noted in the past that he had apparently has refused any type of surgery for his situation. Past EF was around 30%.   Presented to [facility] back in April of 2015 while here visiting - had shortness of breath - EF was down to 15 to 20% - he was diuresed. Did have elevated troponin but felt to be due to his HF exacerbation. Discharge summary mentions that the patient was to be seen in [place] for consideration of TAVR via transaortic or transapical approach given his PVD.   However, I saw the patient here last April - I was told that they wanted to consider TAVR here in [place]. I set up his TAVR consult, dobutamine echo, etc - family then cancelled all of his visits and the patient went back to [place].  Comes in today. Here with daughter and wife. No records brought. Tells me that he has had CABG x 5 with AVR done in [place] - this was done back in October. Sounds like he did ok. Readmitted in December with what sounds like atrial fib - not clear as to what was done. He is doing ok. Sleeps during the day. Not very active. Has had a  fall since he was here - no apparent injury. Now using a cane. No chest pain. Breathing is stable. Weight is down. No palpitations.   Past Medical History  Diagnosis Date  . Coronary artery disease   . Stented coronary artery   . Hypertension   . Aortic valve insufficiency   . CHF (congestive heart failure)   . AF (atrial fibrillation)   . Embolism and thrombosis   . PE (pulmonary embolism)     hx  . H/O cardiac arrest     "3-4 times at least over various years" (12/17/2013)  . H/O aorto-femoral bypass 12/17/2013  . Gout     "a few times"  . Decreased cardiac ejection fraction 12/17/2013  . Aortic valvular disease 12/17/2013  . Elevated troponin level 12/17/2013  . High cholesterol   . DVT, lower extremity   . Type II diabetes mellitus   . History of blood transfusion     "think related to aorto operation"  . GERD (gastroesophageal reflux disease)   . H/O hiatal hernia   . NSTEMI (non-ST elevated myocardial infarction) 2001; 2013    hx/notes 12/17/2013    Past Surgical History  Procedure Laterality Date  . Appendectomy    . Aorta - bilateral femoral artery bypass graft  1990's    "feet had turned black"  . Glaucoma surgery Right     "took cataract off; drilled hole in eyeball to relieve pressure"  . Tibia fracture surgery Right     "just below the knee"  . Cardiac catheterization    . Coronary angioplasty with stent placement  ?2001    "I have a couple stents in my heart"     Medications: Current Outpatient Prescriptions  Medication Sig Dispense Refill  . allopurinol (ZYLOPRIM) 300 MG tablet Take 150 mg by mouth daily.    Marland Kitchen. aspirin EC 81 MG tablet Take 81 mg by mouth daily.    . digoxin (LANOXIN) 0.125 MG tablet Take 0.125 mg by mouth daily.    . furosemide (LASIX) 40 MG tablet Take 40 mg by mouth.    . insulin aspart (NOVOLOG) 100 UNIT/ML injection Inject 10 Units into the skin 3 (three) times daily before meals.    . insulin detemir (LEVEMIR) 100 UNIT/ML injection  Inject 15 Units into the skin daily.    Marland Kitchen. PARoxetine (PAXIL) 10 MG tablet Take 10 mg by mouth daily.    Marland Kitchen. warfarin (COUMADIN) 5 MG tablet Take 5 mg by mouth See admin instructions. Take every day except Saturday    . carvedilol (COREG) 6.25 MG tablet Take 1 tablet (6.25 mg total) by mouth 2 (two) times daily. 60 tablet 3   No current facility-administered medications for this visit.    Allergies: Allergies  Allergen Reactions  . Milk-Related Compounds     Can tolerate ice creams.     Social History: The patient  reports that he has never smoked. He has never used smokeless tobacco. He reports that he drinks alcohol. He reports that he does not use illicit drugs.   Family History: The patient's family history is not on file. Parents are deceased.   Review of Systems: Please see the history of present illness.   Otherwise, the review of systems is positive for sleeping during the day.   All other systems are reviewed and negative.   Physical Exam: VS:  BP 128/74 mmHg  Pulse 95  Ht 5\' 10"  (1.778 m)  Wt 178 lb 8 oz (80.967 kg)  BMI 25.61 kg/m2 .  BMI Body mass index is 25.61 kg/(m^2).  Wt Readings from Last 3 Encounters:  10/18/14 178 lb 8 oz (80.967 kg)  01/02/14 191 lb 12.8 oz (87 kg)  12/19/13 195 lb 1.6 oz (88.497 kg)    General: Pleasant. Well developed, well nourished and in no acute distress. He is hard of hearing. Weight is down considerably from last visit.  HEENT: Normal. Neck: Supple, no JVD, carotid bruits, or masses noted.  Cardiac: Irregular irregular rate and rhythm. No murmurs, rubs, or gallops. No edema.  Respiratory:  Lungs are fairly clear to auscultation bilaterally with normal work of breathing.  GI: Soft and nontender.  MS: No deformity or atrophy. Gait and ROM intact. Has a cane Skin: Warm and dry. Color is normal.  Neuro:  Strength and sensation are intact and no gross focal deficits noted.  Psych: Alert, appropriate and with normal  affect.   LABORATORY DATA:  EKG:  EKG is ordered today. Looks to show Atrial fib with increased VR noted - has LBBB as well.    Lab Results  Component Value Date   WBC 7.2 01/02/2014   HGB 14.9 01/02/2014   HCT 45.3 01/02/2014   PLT 219.0 01/02/2014   GLUCOSE 196* 01/02/2014   NA  137 01/02/2014   K 4.7 01/02/2014   CL 99 01/02/2014   CREATININE 2.2* 01/02/2014   BUN 54* 01/02/2014   CO2 29 01/02/2014   TSH 2.780 12/17/2013   INR 2.62* 12/19/2013    BNP (last 3 results) No results for input(s): BNP in the last 8760 hours.  ProBNP (last 3 results)  Recent Labs  12/17/13 0358 12/18/13 0552 01/02/14 0958  PROBNP 2955.0* 3688.0* 368.0*     Other Studies Reviewed Today:  Echo Study Conclusions from April 2015  - Left ventricle: The cavity size was moderately dilated. Wall thickness was increased in a pattern of mild LVH. Systolic function was severely reduced. The estimated ejection fraction was in the range of 15% to 20%. Severe hypokinesis of the entire myocardium. - Aortic valve: Moderately calcified annulus. Moderately thickened, severely calcified leaflets. There was mild stenosis. Mild regurgitation. Valve area: 0.96cm^2(VTI). Valve area: 0.91cm^2 (Vmax). - Left atrium: The atrium was mildly dilated. - Right ventricle: The cavity size was mildly dilated. Systolic function was mildly reduced. - Tricuspid valve: Mild-moderate regurgitation.  Aortic valve: Moderately calcified annulus. Moderately thickened, severely calcified leaflets. Doppler: There was mild stenosis. Mild regurgitation. VTI ratio of LVOT to aortic valve: 0.21. Valve area: 0.96cm^2(VTI). Indexed valve area: 0.45cm^2/m^2 (VTI). Peak velocity ratio of LVOT to aortic valve: 0.2. Valve area: 0.91cm^2 (Vmax). Indexed valve area: 0.43cm^2/m^2 (Vmax). Mean gradient: 14mm Hg (S). Peak gradient: 24mm Hg (S).    Assessment / Plan:  1. Chronic systolic HF - needs echo updated - he is  compensated.   2. CAD - prior MIs and prior PCI - now s/p CABG x 5 - looks to be doing well. Will get an echo. Follow up labs today.   3. CKD - repeat lab today  4. Severe AS - had refused prior work up in the past - tried to arrange TAVR last April but family then cancelled all follow up and the patient went back to [place] - now s/p AVR along with his CABG - done in [place] - need to get records - doing well.  5. Atrial fib - rate is up some - will increase his Coreg to 6.25 mg BID. See back in a month with EKG - I would favor management with rate control and anticoagulation. He is not symptomatic. Family does not wish to pursue cardioversion.   Current medicines are reviewed with the patient today.  The patient does not have concerns regarding medicines other than what has been noted above.  The following changes have been made:  See above.  Labs/ tests ordered today include:    Orders Placed This Encounter  Procedures  . Basic metabolic panel  . CBC  . Protime-INR  . 2D Echocardiogram without contrast     Disposition:   FU with Dr. Mayford Walker in one month with EKG  Patient is agreeable to this plan and will call if any problems develop in the interim.   Signed: Rosalio Macadamia, RN, ANP-C 10/18/2014 9:58 AM  [facility] [address] [place], [place]  [address] Phone: [phone] Fax: [phone]

## 2014-10-18 NOTE — Patient Instructions (Addendum)
We will be checking the following labs today BMET, CBC, INR  We will update your echocardiogram  We will need a release for records regarding your heart surgery  I am increasing the Coreg to 6.25 mg twice day - this is at drug store  See Dr. Mayford Knifeurner in one month with a EKG  Call the [facility] office at [phone] if you have any questions, problems or concerns.

## 2014-10-18 NOTE — Progress Notes (Deleted)
CARDIOLOGY OFFICE NOTE  Date:  10/18/2014    Omar Walker Date of Birth: 09/07/1927 Medical Record #[id number]  PCP:  Herb GraysSPEAR, TAMMY, MD  Cardiologist:  Mayford Knifeurner    Chief Complaint  Patient presents with  . Aortic Stenosis    Follow up visit - 10 month check. Seen for Dr. Mayford Knifeurner     History of Present Illness: Omar Walker is a 81 y.o. male who presents today for a ***   Past Medical History  Diagnosis Date  . Coronary artery disease   . Stented coronary artery   . Hypertension   . Aortic valve insufficiency   . CHF (congestive heart failure)   . AF (atrial fibrillation)   . Embolism and thrombosis   . PE (pulmonary embolism)     hx  . H/O cardiac arrest     "3-4 times at least over various years" (12/17/2013)  . H/O aorto-femoral bypass 12/17/2013  . Gout     "a few times"  . Decreased cardiac ejection fraction 12/17/2013  . Aortic valvular disease 12/17/2013  . Elevated troponin level 12/17/2013  . High cholesterol   . DVT, lower extremity   . Type II diabetes mellitus   . History of blood transfusion     "think related to aorto operation"  . GERD (gastroesophageal reflux disease)   . H/O hiatal hernia   . NSTEMI (non-ST elevated myocardial infarction) 2001; 2013    hx/notes 12/17/2013    Past Surgical History  Procedure Laterality Date  . Appendectomy    . Aorta - bilateral femoral artery bypass graft  1990's    "feet had turned black"  . Glaucoma surgery Right     "took cataract off; drilled hole in eyeball to relieve pressure"  . Tibia fracture surgery Right     "just below the knee"  . Cardiac catheterization    . Coronary angioplasty with stent placement  ?2001    "I have a couple stents in my heart"     Medications: Current Outpatient Prescriptions  Medication Sig Dispense Refill  . allopurinol (ZYLOPRIM) 300 MG tablet Take 150 mg by mouth daily.    Marland Kitchen. aspirin EC 81 MG tablet Take 81 mg by mouth daily.    . carvedilol (COREG) 3.125 MG  tablet Take 1 tablet (3.125 mg total) by mouth 2 (two) times daily with a meal. 60 tablet 0  . fenofibrate 54 MG tablet Take 54 mg by mouth daily.    Marland Kitchen. glimepiride (AMARYL) 1 MG tablet Take 1 mg by mouth 2 (two) times daily.    Marland Kitchen. lisinopril (PRINIVIL,ZESTRIL) 40 MG tablet Take 0.5 tablets (20 mg total) by mouth daily. 30 tablet 0  . simvastatin (ZOCOR) 80 MG tablet Take 1 tablet (80 mg total) by mouth every evening. 30 tablet 0  . sitaGLIPtin (JANUVIA) 50 MG tablet Take 50 mg by mouth daily.    . timolol (TIMOPTIC) 0.5 % ophthalmic solution Place 1 drop into the left eye daily.    Marland Kitchen. warfarin (COUMADIN) 5 MG tablet Take 5 mg by mouth See admin instructions. Take every day except Saturday     No current facility-administered medications for this visit.    Allergies: Allergies  Allergen Reactions  . Milk-Related Compounds     Can tolerate ice creams.     Social History: The patient  reports that he has never smoked. He has never used smokeless tobacco. He reports that he drinks alcohol. He reports that he  does not use illicit drugs.   Family History: The patient's ***family history is not on file.   Review of Systems: Please see the history of present illness.   Otherwise, the review of systems is positive for {NONE DEFAULTED:18576}.   All other systems are reviewed and negative.   Physical Exam: VS:  Ht  (1.778 m)  Wt 178 lb 8 oz (80.967 kg)  BMI 25.61 kg/m2 .  BMI Body mass index is 25.61 kg/(m^2).  Wt Readings from Last 3 Encounters:  10/18/14 178 lb 8 oz (80.967 kg)  01/02/14 191 lb 12.8 oz (87 kg)  12/19/13 195 lb 1.6 oz (88.497 kg)    General: Pleasant. Well developed, well nourished and in no acute distress.  HEENT: Normal. Neck: Supple, no JVD, carotid bruits, or masses noted.  Cardiac: ***Regular rate and rhythm. No murmurs, rubs, or gallops. No edema.  Respiratory:  Lungs are clear to auscultation bilaterally with normal work of breathing.  GI: Soft and  nontender.  MS: No deformity or atrophy. Gait and ROM intact. Skin: Warm and dry. Color is normal.  Neuro:  Strength and sensation are intact and no gross focal deficits noted.  Psych: Alert, appropriate and with normal affect.   LABORATORY DATA:  EKG:  EKG {ACTION; IS/IS ZOX:09604540} ordered today. This demonstrates ***.  Lab Results  Component Value Date   WBC 7.2 01/02/2014   HGB 14.9 01/02/2014   HCT 45.3 01/02/2014   PLT 219.0 01/02/2014   GLUCOSE 196* 01/02/2014   NA 137 01/02/2014   K 4.7 01/02/2014   CL 99 01/02/2014   CREATININE 2.2* 01/02/2014   BUN 54* 01/02/2014   CO2 29 01/02/2014   TSH 2.780 12/17/2013   INR 2.62* 12/19/2013    BNP (last 3 results) No results for input(s): BNP in the last 8760 hours.  ProBNP (last 3 results)  Recent Labs  12/17/13 0358 12/18/13 0552 01/02/14 0958  PROBNP 2955.0* 3688.0* 368.0*     Other Studies Reviewed Today:   Assessment/Plan:   Current medicines are reviewed with the patient today.  The patient does not have concerns regarding medicines other than what has been noted above.  The following changes have been made:  See above.  Labs/ tests ordered today include:   No orders of the defined types were placed in this encounter.     Disposition:   FU with *** in {gen number 9-81:191478} {TIME; UNITS DAY/WEEK/MONTH:19136}  Patient is agreeable to this plan and will call if any problems develop in the interim.   Signed: Rosalio Macadamia, RN, ANP-C 10/18/2014 9:03 AM  [facility] [address] [place], [place]  [address] Phone: [phone] Fax: [phone]

## 2014-10-23 ENCOUNTER — Ambulatory Visit (HOSPITAL_COMMUNITY): Payer: Medicare Other | Attending: Cardiology | Admitting: Cardiology

## 2014-10-23 DIAGNOSIS — N189 Chronic kidney disease, unspecified: Secondary | ICD-10-CM | POA: Insufficient documentation

## 2014-10-23 DIAGNOSIS — I259 Chronic ischemic heart disease, unspecified: Secondary | ICD-10-CM | POA: Insufficient documentation

## 2014-10-23 DIAGNOSIS — I359 Nonrheumatic aortic valve disorder, unspecified: Secondary | ICD-10-CM | POA: Insufficient documentation

## 2014-10-23 DIAGNOSIS — I5022 Chronic systolic (congestive) heart failure: Secondary | ICD-10-CM | POA: Diagnosis not present

## 2014-10-23 NOTE — Progress Notes (Signed)
Echo performed. 

## 2014-10-28 ENCOUNTER — Telehealth: Payer: Self-pay | Admitting: Cardiology

## 2014-10-28 NOTE — Telephone Encounter (Signed)
New Message    Patient daughter is returning calling.  Please give a call back.

## 2014-10-28 NOTE — Telephone Encounter (Signed)
New message      Pt pulse has been over 100 since 10-21-14.  Pt is taking medication as directed.  Please advise

## 2014-10-28 NOTE — Telephone Encounter (Signed)
Omar Walker concerned because Deandra's HR is constantly over 100, ranging from 102-108. She is concerned because at OV with Lawson FiscalLori recently, Coreg was increased in hopes of rate control and the HR has not decreased. Patient is asymptomatic and has no complaints.   To Dr. Mayford Knifeurner.

## 2014-10-28 NOTE — Telephone Encounter (Signed)
Left message that we have spoken since message was left and I will call once Dr. Mayford Knifeurner sends recommendations.

## 2014-10-28 NOTE — Telephone Encounter (Signed)
Increase coreg to 12.5mg  BID and check HR and BP daily for a week and call with results

## 2014-10-30 NOTE — Telephone Encounter (Signed)
Instructed patient's daughter to INCREASE Coreg to 12.5 mg BID and to check HR and BP daily for a week and call with results.   Kendal HymenBonnie st the patient stopped Lasix last month and has started retaining water (roughly 10 pounds since stopping) so she told him to restart it today. He is taking 40 mg daily. Offered and recommended Kendal HymenBonnie an appointment for her father to see the flex provider today and she said he adamantly refuses to come to the doctor before his appointment 3/11. Instructed Kendal HymenBonnie to follow-up with me next week when she calls with the blood pressure readings OR BEFORE if her father experiences worsening symptoms.

## 2014-10-30 NOTE — Telephone Encounter (Signed)
Follow up ° ° ° ° ° °Returning Katy's call °

## 2014-10-30 NOTE — Telephone Encounter (Signed)
Left message to call back  

## 2014-10-31 ENCOUNTER — Telehealth: Payer: Self-pay | Admitting: Physician Assistant

## 2014-10-31 ENCOUNTER — Inpatient Hospital Stay (HOSPITAL_COMMUNITY)
Admission: EM | Admit: 2014-10-31 | Discharge: 2014-11-04 | DRG: 177 | Disposition: A | Discharge status: Home / Self Care | Payer: Medicare Other | Attending: Internal Medicine | Admitting: Internal Medicine

## 2014-10-31 ENCOUNTER — Emergency Department (HOSPITAL_COMMUNITY): Payer: Medicare Other

## 2014-10-31 ENCOUNTER — Other Ambulatory Visit (HOSPITAL_COMMUNITY): Payer: Self-pay

## 2014-10-31 ENCOUNTER — Encounter (HOSPITAL_COMMUNITY): Payer: Self-pay

## 2014-10-31 DIAGNOSIS — Z66 Do not resuscitate: Secondary | ICD-10-CM | POA: Diagnosis not present

## 2014-10-31 DIAGNOSIS — I252 Old myocardial infarction: Secondary | ICD-10-CM

## 2014-10-31 DIAGNOSIS — I4891 Unspecified atrial fibrillation: Secondary | ICD-10-CM | POA: Diagnosis present

## 2014-10-31 DIAGNOSIS — E119 Type 2 diabetes mellitus without complications: Secondary | ICD-10-CM

## 2014-10-31 DIAGNOSIS — R1310 Dysphagia, unspecified: Secondary | ICD-10-CM

## 2014-10-31 DIAGNOSIS — Z952 Presence of prosthetic heart valve: Secondary | ICD-10-CM

## 2014-10-31 DIAGNOSIS — I5023 Acute on chronic systolic (congestive) heart failure: Secondary | ICD-10-CM | POA: Diagnosis present

## 2014-10-31 DIAGNOSIS — Z79899 Other long term (current) drug therapy: Secondary | ICD-10-CM | POA: Diagnosis not present

## 2014-10-31 DIAGNOSIS — I4892 Unspecified atrial flutter: Secondary | ICD-10-CM | POA: Diagnosis present

## 2014-10-31 DIAGNOSIS — J690 Pneumonitis due to inhalation of food and vomit: Principal | ICD-10-CM

## 2014-10-31 DIAGNOSIS — M109 Gout, unspecified: Secondary | ICD-10-CM | POA: Diagnosis present

## 2014-10-31 DIAGNOSIS — Z794 Long term (current) use of insulin: Secondary | ICD-10-CM | POA: Diagnosis not present

## 2014-10-31 DIAGNOSIS — Z951 Presence of aortocoronary bypass graft: Secondary | ICD-10-CM

## 2014-10-31 DIAGNOSIS — R0600 Dyspnea, unspecified: Secondary | ICD-10-CM

## 2014-10-31 DIAGNOSIS — Z7901 Long term (current) use of anticoagulants: Secondary | ICD-10-CM

## 2014-10-31 DIAGNOSIS — E871 Hypo-osmolality and hyponatremia: Secondary | ICD-10-CM | POA: Diagnosis not present

## 2014-10-31 DIAGNOSIS — K219 Gastro-esophageal reflux disease without esophagitis: Secondary | ICD-10-CM | POA: Diagnosis present

## 2014-10-31 DIAGNOSIS — I5022 Chronic systolic (congestive) heart failure: Secondary | ICD-10-CM

## 2014-10-31 DIAGNOSIS — I2699 Other pulmonary embolism without acute cor pulmonale: Secondary | ICD-10-CM

## 2014-10-31 DIAGNOSIS — R042 Hemoptysis: Secondary | ICD-10-CM | POA: Insufficient documentation

## 2014-10-31 DIAGNOSIS — R059 Cough, unspecified: Secondary | ICD-10-CM | POA: Insufficient documentation

## 2014-10-31 DIAGNOSIS — I2510 Atherosclerotic heart disease of native coronary artery without angina pectoris: Secondary | ICD-10-CM | POA: Diagnosis present

## 2014-10-31 DIAGNOSIS — N183 Chronic kidney disease, stage 3 unspecified: Secondary | ICD-10-CM

## 2014-10-31 DIAGNOSIS — R05 Cough: Secondary | ICD-10-CM | POA: Insufficient documentation

## 2014-10-31 DIAGNOSIS — Z8674 Personal history of sudden cardiac arrest: Secondary | ICD-10-CM | POA: Diagnosis not present

## 2014-10-31 DIAGNOSIS — J90 Pleural effusion, not elsewhere classified: Secondary | ICD-10-CM

## 2014-10-31 DIAGNOSIS — M10012 Idiopathic gout, left shoulder: Secondary | ICD-10-CM

## 2014-10-31 DIAGNOSIS — I129 Hypertensive chronic kidney disease with stage 1 through stage 4 chronic kidney disease, or unspecified chronic kidney disease: Secondary | ICD-10-CM | POA: Diagnosis present

## 2014-10-31 DIAGNOSIS — Z86711 Personal history of pulmonary embolism: Secondary | ICD-10-CM

## 2014-10-31 DIAGNOSIS — Z7982 Long term (current) use of aspirin: Secondary | ICD-10-CM

## 2014-10-31 DIAGNOSIS — Z95828 Presence of other vascular implants and grafts: Secondary | ICD-10-CM

## 2014-10-31 DIAGNOSIS — E11649 Type 2 diabetes mellitus with hypoglycemia without coma: Secondary | ICD-10-CM | POA: Diagnosis present

## 2014-10-31 DIAGNOSIS — I359 Nonrheumatic aortic valve disorder, unspecified: Secondary | ICD-10-CM | POA: Diagnosis present

## 2014-10-31 DIAGNOSIS — I10 Essential (primary) hypertension: Secondary | ICD-10-CM | POA: Diagnosis present

## 2014-10-31 LAB — BASIC METABOLIC PANEL
Anion gap: 13 (ref 5–15)
BUN: 26 mg/dL — AB (ref 6–23)
CO2: 22 mmol/L (ref 19–32)
CREATININE: 1.42 mg/dL — AB (ref 0.50–1.35)
Calcium: 8.9 mg/dL (ref 8.4–10.5)
Chloride: 98 mmol/L (ref 96–112)
GFR calc Af Amer: 51 mL/min — ABNORMAL LOW (ref >90)
GFR, EST NON AFRICAN AMERICAN: 44 mL/min — AB (ref >90)
Glucose, Bld: 160 mg/dL — ABNORMAL HIGH (ref 70–99)
Potassium: 4.2 mmol/L (ref 3.5–5.1)
SODIUM: 133 mmol/L — AB (ref 135–145)

## 2014-10-31 LAB — CBC WITH DIFFERENTIAL/PLATELET
Basophils Absolute: 0.1 10*3/uL (ref 0.0–0.1)
Basophils Relative: 1 % (ref 0–1)
EOS ABS: 0 10*3/uL (ref 0.0–0.7)
EOS PCT: 0 % (ref 0–5)
HCT: 41.5 % (ref 39.0–52.0)
Hemoglobin: 13.1 g/dL (ref 13.0–17.0)
Lymphocytes Relative: 8 % — ABNORMAL LOW (ref 12–46)
Lymphs Abs: 0.8 10*3/uL (ref 0.7–4.0)
MCH: 28.9 pg (ref 26.0–34.0)
MCHC: 31.6 g/dL (ref 30.0–36.0)
MCV: 91.4 fL (ref 78.0–100.0)
MONOS PCT: 9 % (ref 3–12)
Monocytes Absolute: 1 10*3/uL (ref 0.1–1.0)
Neutro Abs: 8.6 10*3/uL — ABNORMAL HIGH (ref 1.7–7.7)
Neutrophils Relative %: 82 % — ABNORMAL HIGH (ref 43–77)
PLATELETS: 208 10*3/uL (ref 150–400)
RBC: 4.54 MIL/uL (ref 4.22–5.81)
RDW: 18.9 % — ABNORMAL HIGH (ref 11.5–15.5)
WBC: 10.5 10*3/uL (ref 4.0–10.5)

## 2014-10-31 LAB — I-STAT TROPONIN, ED: Troponin i, poc: 0 ng/mL (ref 0.00–0.08)

## 2014-10-31 LAB — URINALYSIS, ROUTINE W REFLEX MICROSCOPIC
Bilirubin Urine: NEGATIVE
GLUCOSE, UA: NEGATIVE mg/dL
Hgb urine dipstick: NEGATIVE
Ketones, ur: NEGATIVE mg/dL
Leukocytes, UA: NEGATIVE
Nitrite: NEGATIVE
Protein, ur: NEGATIVE mg/dL
Specific Gravity, Urine: 1.019 (ref 1.005–1.030)
Urobilinogen, UA: 1 mg/dL (ref 0.0–1.0)
pH: 5 (ref 5.0–8.0)

## 2014-10-31 LAB — PROTIME-INR
INR: 4.01 — ABNORMAL HIGH (ref 0.00–1.49)
PROTHROMBIN TIME: 39.3 s — AB (ref 11.6–15.2)

## 2014-10-31 LAB — BRAIN NATRIURETIC PEPTIDE: B NATRIURETIC PEPTIDE 5: 925.8 pg/mL — AB (ref 0.0–100.0)

## 2014-10-31 MED ORDER — INSULIN ASPART 100 UNIT/ML ~~LOC~~ SOLN
10.0000 [IU] | Freq: Three times a day (TID) | SUBCUTANEOUS | Status: DC
  Administered 2014-11-02: 10 [IU] via SUBCUTANEOUS

## 2014-10-31 MED ORDER — CEFTRIAXONE SODIUM 1 G IJ SOLR
1.0000 g | Freq: Once | INTRAMUSCULAR | Status: AC
  Administered 2014-10-31: 1 g via INTRAVENOUS
  Filled 2014-10-31: qty 10

## 2014-10-31 MED ORDER — AZITHROMYCIN 500 MG IV SOLR: 500.0000 mg | Freq: Once | INTRAVENOUS | Status: DC

## 2014-10-31 MED ORDER — SODIUM CHLORIDE 0.9 % IV SOLN
INTRAVENOUS | Status: DC
  Administered 2014-11-01: 01:00:00 via INTRAVENOUS

## 2014-10-31 MED ORDER — DEXTROSE 5 % IV SOLN
500.0000 mg | Freq: Once | INTRAVENOUS | Status: AC
  Administered 2014-11-01: 500 mg via INTRAVENOUS
  Filled 2014-10-31: qty 500

## 2014-10-31 MED ORDER — INSULIN DETEMIR 100 UNIT/ML ~~LOC~~ SOLN
10.0000 [IU] | Freq: Every day | SUBCUTANEOUS | Status: DC
  Administered 2014-11-01 – 2014-11-02 (×2): 10 [IU] via SUBCUTANEOUS
  Filled 2014-10-31 (×2): qty 0.1

## 2014-10-31 MED ORDER — CLINDAMYCIN PHOSPHATE 600 MG/50ML IV SOLN
600.0000 mg | Freq: Three times a day (TID) | INTRAVENOUS | Status: DC
  Administered 2014-11-01 – 2014-11-04 (×11): 600 mg via INTRAVENOUS
  Filled 2014-10-31 (×14): qty 50

## 2014-10-31 MED ORDER — ASPIRIN EC 81 MG PO TBEC
81.0000 mg | DELAYED_RELEASE_TABLET | Freq: Every day | ORAL | Status: DC
Start: 2014-11-01 — End: 2014-11-04
  Administered 2014-11-01 – 2014-11-04 (×4): 81 mg via ORAL
  Filled 2014-10-31 (×4): qty 1

## 2014-10-31 MED ORDER — PAROXETINE HCL 10 MG PO TABS
10.0000 mg | ORAL_TABLET | Freq: Every day | ORAL | Status: DC
  Administered 2014-11-01 – 2014-11-04 (×4): 10 mg via ORAL
  Filled 2014-10-31 (×4): qty 1

## 2014-10-31 MED ORDER — SPIRONOLACTONE 25 MG PO TABS
25.0000 mg | ORAL_TABLET | Freq: Every day | ORAL | Status: DC
  Administered 2014-11-01 – 2014-11-04 (×4): 25 mg via ORAL
  Filled 2014-10-31 (×4): qty 1

## 2014-10-31 MED ORDER — DIGOXIN 125 MCG PO TABS
0.1250 mg | ORAL_TABLET | Freq: Every day | ORAL | Status: DC
  Administered 2014-11-01 – 2014-11-04 (×4): 0.125 mg via ORAL
  Filled 2014-10-31 (×4): qty 1

## 2014-10-31 MED ORDER — CARVEDILOL 12.5 MG PO TABS
12.5000 mg | ORAL_TABLET | Freq: Two times a day (BID) | ORAL | Status: DC
  Administered 2014-11-01 – 2014-11-03 (×3): 12.5 mg via ORAL
  Filled 2014-10-31 (×7): qty 1

## 2014-10-31 MED ORDER — SODIUM CHLORIDE 0.9 % IV SOLN
Freq: Once | INTRAVENOUS | Status: AC
  Administered 2014-10-31: 22:00:00 via INTRAVENOUS

## 2014-10-31 MED ORDER — INSULIN ASPART 100 UNIT/ML ~~LOC~~ SOLN
0.0000 [IU] | Freq: Three times a day (TID) | SUBCUTANEOUS | Status: DC
  Administered 2014-11-01 – 2014-11-04 (×6): 2 [IU] via SUBCUTANEOUS

## 2014-10-31 MED ORDER — ALLOPURINOL 150 MG HALF TABLET
150.0000 mg | ORAL_TABLET | Freq: Every day | ORAL | Status: DC
  Administered 2014-11-01 – 2014-11-04 (×4): 150 mg via ORAL
  Filled 2014-10-31 (×4): qty 1

## 2014-10-31 NOTE — H&P (Addendum)
[facility] History and Physical  Omar EdenRobert G Rosenberry ZOX:[id number] DOB: 06/24/1928 DOA: 10/31/2014  Referring physician: ED physician PCP: Quintella ReichertURNER,TRACI R, MD  Specialists:   Chief Complaint: cough  HPI: Omar EdenRobert G Walker is a 81 y.o. male 's past medical history of hypertension, gout, coronary artery disease, post status of stent placement, post status of CABG, congestive heart failure, atrial fibrillation, history of pulmonary embolism, long-term Coumadin use, history of aortic valve replacement, who presents with cough.  Patient reports that he has been coughing in the past 3-5 days. He coughs up small amount of pinkish and jellylike sputum. He does not have fever, chills, shortness of breath, chest pain.   He also reports that he has been choking with food during the last week. He is okay with fluid, but has difficulty in swallowing of solid food. Not have a body weight loss. No family history of esophageal cancer, but his mother had esophageal stricture. Patient does not have symptoms of stroke, no unilateral weakness, numbness or tingling sensations.  Patient denies fever, chills, headaches, abdominal pain, diarrhea, constipation, dysuria, urgency, frequency, hematuria, skin rashes. No vision change or hearing loss.  In ED, patient was found to have INR 4.01, BNP 925, negative troponin, no leukocytosis, temperature normal. Chest x-ray showed small to moderate RIGHT pleural effusion with underlying consolidation. Patient is admitted to inpatient for further evaluation and treatment.  Review of Systems: As presented in the history of presenting illness, rest negative.  Where does patient live?  At home Can patient participate in ADLs? Yes  Allergy:  Allergies  Allergen Reactions  . Milk-Related Compounds     Can tolerate ice creams.     Past Medical History  Diagnosis Date  . Coronary artery disease   . Stented coronary artery   . Hypertension   . Aortic valve insufficiency   .  CHF (congestive heart failure)   . AF (atrial fibrillation)   . Embolism and thrombosis   . PE (pulmonary embolism)     hx  . H/O cardiac arrest     "3-4 times at least over various years" (12/17/2013)  . H/O aorto-femoral bypass 12/17/2013  . Gout     "a few times"  . Decreased cardiac ejection fraction 12/17/2013  . Aortic valvular disease 12/17/2013  . Elevated troponin level 12/17/2013  . High cholesterol   . DVT, lower extremity   . Type II diabetes mellitus   . History of blood transfusion     "think related to aorto operation"  . GERD (gastroesophageal reflux disease)   . H/O hiatal hernia   . NSTEMI (non-ST elevated myocardial infarction) 2001; 2013    hx/notes 12/17/2013    Past Surgical History  Procedure Laterality Date  . Appendectomy    . Aorta - bilateral femoral artery bypass graft  1990's    "feet had turned black"  . Glaucoma surgery Right     "took cataract off; drilled hole in eyeball to relieve pressure"  . Tibia fracture surgery Right     "just below the knee"  . Cardiac catheterization    . Coronary angioplasty with stent placement  ?2001    "I have a couple stents in my heart"    Social History:  reports that he has never smoked. He has never used smokeless tobacco. He reports that he drinks alcohol. He reports that he does not use illicit drugs.  Family History: patient's mother had esophageal stricture  Prior to Admission medications   Medication Sig  Start Date End Date Taking? Authorizing Provider  allopurinol (ZYLOPRIM) 300 MG tablet Take 150 mg by mouth daily.    Historical Provider, MD  aspirin EC 81 MG tablet Take 81 mg by mouth daily.    Historical Provider, MD  carvedilol (COREG) 6.25 MG tablet Take 1 tablet (6.25 mg total) by mouth 2 (two) times daily. 10/18/14   Rosalio Macadamia, NP  digoxin (LANOXIN) 0.125 MG tablet Take 0.125 mg by mouth daily.    Historical Provider, MD  furosemide (LASIX) 40 MG tablet Take 40 mg by mouth.    Historical  Provider, MD  insulin aspart (NOVOLOG) 100 UNIT/ML injection Inject 10 Units into the skin 3 (three) times daily before meals.    Historical Provider, MD  insulin detemir (LEVEMIR) 100 UNIT/ML injection Inject 15 Units into the skin daily.    Historical Provider, MD  PARoxetine (PAXIL) 10 MG tablet Take 10 mg by mouth daily.    Historical Provider, MD  warfarin (COUMADIN) 5 MG tablet Take 5 mg by mouth See admin instructions. Take every day except Saturday    Historical Provider, MD    Physical Exam: Filed Vitals:   10/31/14 2145 10/31/14 2215 10/31/14 2253 11/01/14 0100  BP: 127/78 133/80 122/69   Pulse: 103 103    Temp:   97.7 F (36.5 C)   TempSrc:   Oral   Resp: Height:     (1.778 m)  Weight:    80.967 kg (178 lb 8 oz)  SpO2: 95% 94%     General: Not in acute distress HEENT:       Eyes: PERRL, EOMI, no scleral icterus       ENT: No discharge from the ears and nose, no pharynx injection, no tonsillar enlargement.        Neck: No JVD, no bruit, no mass felt. Cardiac: S1/S2, RRR, No murmurs, No gallops or rubs Pulm: Good air movement bilaterally. Clear to auscultation bilaterally. No rales, wheezing, rhonchi or rubs. Abd: Soft, nondistended, nontender, no rebound pain, no organomegaly, BS present Ext: trace leg edema bilaterally. 2+DP/PT pulse bilaterally Musculoskeletal: No joint deformities, erythema, or stiffness, ROM full Skin: No rashes.  Neuro: Alert and oriented X3, cranial nerves II-XII grossly intact, muscle strength 5/5 in all extremeties, sensation to light touch intact. Brachial reflex 2+ bilaterally. Knee reflex 1+ bilaterally. Negative Babinski's sign. Normal finger to nose test. Psych: Patient is not psychotic, no suicidal or hemocidal ideation.  Labs on Admission:  Basic Metabolic Panel:  Recent Labs Lab 10/31/14 2019  NA 133*  K 4.2  CL 98  CO2 22  GLUCOSE 160*  BUN 26*  CREATININE 1.42*  CALCIUM 8.9   Liver Function Tests: No  results for input(s): AST, ALT, ALKPHOS, BILITOT, PROT, ALBUMIN in the last 168 hours. No results for input(s): LIPASE, AMYLASE in the last 168 hours. No results for input(s): AMMONIA in the last 168 hours. CBC:  Recent Labs Lab 10/31/14 2019  WBC 10.5  NEUTROABS 8.6*  HGB 13.1  HCT 41.5  MCV 91.4  PLT 208   Cardiac Enzymes: No results for input(s): CKTOTAL, CKMB, CKMBINDEX, TROPONINI in the last 168 hours.  BNP (last 3 results)  Recent Labs  10/31/14 2020  BNP 925.8*    ProBNP (last 3 results)  Recent Labs  12/17/13 0358 12/18/13 0552 01/02/14 0958  PROBNP 2955.0* 3688.0* 368.0*    CBG:  Recent Labs Lab 11/01/14 0029 11/01/14 0419  GLUCAP 99 109*  Radiological Exams on Admission: Dg Chest 2 View  10/31/2014   CLINICAL DATA:  Hemoptysis, hypertension, diabetes CHF.  Edema.  EXAM: CHEST  2 VIEW  COMPARISON:  Chest radiograph December 17, 2013  FINDINGS: The cardiac silhouette appears moderately enlarged, unchanged. Mediastinal silhouette is nonsuspicious, status post median sternotomy. Increasing small to moderate RIGHT pleural effusion with underlying consolidation. Mild chronic interstitial changes with strandy densities LEFT lung base. No pneumothorax.  Mild degenerative change of the thoracic spine. Soft tissue planes are nonsuspicious.  IMPRESSION: Small to moderate RIGHT pleural effusion with underlying consolidation. Recommend follow-up chest radiograph to verify improvement.  Stable cardiomegaly. Interstitial prominence suggest acute or chronic pulmonary edema. LEFT lung base atelectasis.   Electronically Signed   By: Awilda Metro   On: 10/31/2014 21:03    EKG: Independently reviewed. Old left bundle blockage.  Assessment/Plan Principal Problem:   Aspiration pneumonia Active Problems:   CHF (congestive heart failure)   PE (pulmonary embolism)   H/O aorto-femoral bypass   Coronary artery disease   Diabetes mellitus   Gout   Aortic valvular  disease   Diabetes mellitus without complication   Essential hypertension   Difficulty swallowing  Aspiration pneumonia: Patient's productive cough with jelly like sputum production is most likely caused by aspiration pneumonia. Patient recently has difficulty swallowing, putting patient at high risk of aspiration. Currently patient does not have a sepsis. Hemodynamically stable.  - Will admit to Telemetry Bed - IV Clindamycin - Urine legionella and S. pneumococcal antigen - IVF: 50 cc/h - Albuterol Nebulizer prn for SOB - Follow up blood culture x2, sputum culture   Difficulty swallowing: Etiology is not clear. Differential diagnoses include esophageal stricture, and esophageal cancer. -SLP -Barium swallowing study  Pulmonary embolism: patient is on Coumadin, with supratherapeutic INR at 4.01. Patient's productive cough does not have bright red colored blood as I personally saw the sputum. -continue Coumadin per pharmacy, hold tonight.  Diabetes mellitus: Patient is on Levemir15 units at home. -decrease Levemir dose to 10 units daily -Sliding-scale insulin  Congestive heart failure: 2-D echo on 10/23/14 showed EF 15-20%. Patient is on Lasix at home. Patient is euvolemic on admission, though BNP 925 -Hold her Lasix -continue spironolactone  Coronary artery disease: Post status of the stent placement and CABG. no chest pain currently. -continue aspirin, Coreg,   DVT ppx: SCD Code Status: Full code Family Communication: Yes, patient's  family     at bed side Disposition Plan: Admit to inpatient   Date of Service 11/01/2014    Lorretta Harp [facility] Pager [phone]  If 7PM-7AM, please contact night-coverage [email or web address] Password [facility] 11/01/2014, 4:43 AM

## 2014-10-31 NOTE — ED Notes (Signed)
Pt from home with reported weight gain and hemoptysis. Pt has reportedly put on 10 pounds over the past month.  Family reports that pt has not been urinating and they started him again on his fluid pill but pt has yet to void.  Also sts pt abdomen is distended more than normal.

## 2014-10-31 NOTE — Progress Notes (Signed)
ANTICOAGULATION CONSULT NOTE - Initial Consult  Pharmacy Consult for Warfarin Indication: atrial fibrillation and pulmonary embolus  Allergies  Allergen Reactions  . Milk-Related Compounds     Can tolerate ice creams.     Patient Measurements:   Heparin Dosing Weight:   Vital Signs: Temp: 97.7 F (36.5 C) (02/25 2008) Temp Source: Oral (02/25 2008) BP: 133/80 mmHg (02/25 2215) Pulse Rate: 103 (02/25 2215)  Labs:  Recent Labs  10/31/14 2019  HGB 13.1  HCT 41.5  PLT 208  LABPROT 39.3*  INR 4.01*  CREATININE 1.42*    Estimated Creatinine Clearance: 40 mL/min (by C-G formula based on Cr of 1.42).   Medical History: Past Medical History  Diagnosis Date  . Coronary artery disease   . Stented coronary artery   . Hypertension   . Aortic valve insufficiency   . CHF (congestive heart failure)   . AF (atrial fibrillation)   . Embolism and thrombosis   . PE (pulmonary embolism)     hx  . H/O cardiac arrest     "3-4 times at least over various years" (12/17/2013)  . H/O aorto-femoral bypass 12/17/2013  . Gout     "a few times"  . Decreased cardiac ejection fraction 12/17/2013  . Aortic valvular disease 12/17/2013  . Elevated troponin level 12/17/2013  . High cholesterol   . DVT, lower extremity   . Type II diabetes mellitus   . History of blood transfusion     "think related to aorto operation"  . GERD (gastroesophageal reflux disease)   . H/O hiatal hernia   . NSTEMI (non-ST elevated myocardial infarction) 2001; 2013    hx/notes 12/17/2013    Medications:   (Not in a hospital admission) Scheduled:   Infusions:  . azithromycin (ZITHROMAX) 500 MG IVPB      Assessment: 81yo male with history of Afib, PE/DVT, cardiac arrest 12/2013, and CAD presents with hemoptysis and edema. Pharmacy is consulted to dose warfarin for atrial fibrillation and history of VTE. CBC is wnl, sCr elevated to 1.42, BNP 925.8, Trop neg.  Warfarin PTA dose: 5mg  Tues, Wed, Sat and 2.5mg   AODs  Goal of Therapy:  INR 2-3 Monitor platelets by anticoagulation protocol: Yes   Plan:  Hold warfarin tonight x1 Daily INR Continue to monitor H&H and platelets  Monitor s/sx of bleeding  Arlean Hoppingorey M. Newman PiesBall, PharmD Clinical Pharmacist Pager [phone] 10/31/2014,10:37 PM

## 2014-10-31 NOTE — ED Provider Notes (Signed)
CSN: [id number]     Arrival date & time 10/31/14  1944 History   First MD Initiated Contact with Patient 10/31/14 2027     Chief Complaint  Patient presents with  . Hemoptysis  . Edema     (Consider location/radiation/quality/duration/timing/severity/associated sxs/prior Treatment) Patient is a 81 y.o. male presenting with cough. The history is provided by the patient.  Cough Cough characteristics:  Productive Sputum characteristics:  Yellow and white (blood-tinged) Severity:  Mild Onset quality:  Gradual Duration:  2 days Timing:  Constant Progression:  Unchanged Chronicity:  New Smoker: no   Context: sick contacts and upper respiratory infection (multiple family members have a URI)   Context: not weather changes   Relieved by:  Nothing Worsened by:  Nothing tried Associated symptoms: no chest pain, no fever and no shortness of breath     Past Medical History  Diagnosis Date  . Coronary artery disease   . Stented coronary artery   . Hypertension   . Aortic valve insufficiency   . CHF (congestive heart failure)   . AF (atrial fibrillation)   . Embolism and thrombosis   . PE (pulmonary embolism)     hx  . H/O cardiac arrest     "3-4 times at least over various years" (12/17/2013)  . H/O aorto-femoral bypass 12/17/2013  . Gout     "a few times"  . Decreased cardiac ejection fraction 12/17/2013  . Aortic valvular disease 12/17/2013  . Elevated troponin level 12/17/2013  . High cholesterol   . DVT, lower extremity   . Type II diabetes mellitus   . History of blood transfusion     "think related to aorto operation"  . GERD (gastroesophageal reflux disease)   . H/O hiatal hernia   . NSTEMI (non-ST elevated myocardial infarction) 2001; 2013    hx/notes 12/17/2013   Past Surgical History  Procedure Laterality Date  . Appendectomy    . Aorta - bilateral femoral artery bypass graft  1990's    "feet had turned black"  . Glaucoma surgery Right     "took cataract off;  drilled hole in eyeball to relieve pressure"  . Tibia fracture surgery Right     "just below the knee"  . Cardiac catheterization    . Coronary angioplasty with stent placement  ?2001    "I have a couple stents in my heart"   History reviewed. No pertinent family history. History  Substance Use Topics  . Smoking status: Never Smoker   . Smokeless tobacco: Never Used  . Alcohol Use: Yes     Comment: 12/17/2013 "drink 1-2 beers on occasion in the summertime"    Review of Systems  Constitutional: Negative for fever.  Respiratory: Positive for cough (productive) and choking (with some solids, not all. No difficulty swallowing liquids). Negative for shortness of breath.   Cardiovascular: Negative for chest pain.  Gastrointestinal: Negative for vomiting and abdominal pain.  All other systems reviewed and are negative.     Allergies  Milk-related compounds  Home Medications   Prior to Admission medications   Medication Sig Start Date End Date Taking? Authorizing Provider  allopurinol (ZYLOPRIM) 300 MG tablet Take 150 mg by mouth daily.    Historical Provider, MD  aspirin EC 81 MG tablet Take 81 mg by mouth daily.    Historical Provider, MD  carvedilol (COREG) 6.25 MG tablet Take 1 tablet (6.25 mg total) by mouth 2 (two) times daily. 10/18/14   Rosalio MacadamiaLori C Gerhardt, NP  digoxin (  LANOXIN) 0.125 MG tablet Take 0.125 mg by mouth daily.    Historical Provider, MD  furosemide (LASIX) 40 MG tablet Take 40 mg by mouth.    Historical Provider, MD  insulin aspart (NOVOLOG) 100 UNIT/ML injection Inject 10 Units into the skin 3 (three) times daily before meals.    Historical Provider, MD  insulin detemir (LEVEMIR) 100 UNIT/ML injection Inject 15 Units into the skin daily.    Historical Provider, MD  PARoxetine (PAXIL) 10 MG tablet Take 10 mg by mouth daily.    Historical Provider, MD  warfarin (COUMADIN) 5 MG tablet Take 5 mg by mouth See admin instructions. Take every day except Saturday     Historical Provider, MD   BP 128/74 mmHg  Pulse 103  Temp(Src) 97.7 F (36.5 C) (Oral)  Resp 18  SpO2 97% Physical Exam  Constitutional: He is oriented to person, place, and time. He appears well-developed and well-nourished. No distress.  HENT:  Head: Normocephalic and atraumatic.  Mouth/Throat: No oropharyngeal exudate.  Eyes: EOM are normal. Pupils are equal, round, and reactive to light.  Neck: Normal range of motion. Neck supple.  Cardiovascular: Normal rate and regular rhythm.  Exam reveals no friction rub.   No murmur heard. Pulmonary/Chest: Effort normal and breath sounds normal. No respiratory distress. He has no wheezes. He has no rales.  Abdominal: Soft. He exhibits no distension. There is no tenderness. There is no rebound.  Musculoskeletal: Normal range of motion. He exhibits no edema.  Neurological: He is alert and oriented to person, place, and time.  Skin: No rash noted. He is not diaphoretic.  Nursing note and vitals reviewed.   ED Course  Procedures (including critical care time) Labs Review Labs Reviewed  CBC WITH DIFFERENTIAL/PLATELET - Abnormal; Notable for the following:    RDW 18.9 (*)    Neutrophils Relative % 82 (*)    Neutro Abs 8.6 (*)    Lymphocytes Relative 8 (*)    All other components within normal limits  BASIC METABOLIC PANEL  BRAIN NATRIURETIC PEPTIDE  URINALYSIS, ROUTINE W REFLEX MICROSCOPIC  I-STAT TROPOININ, ED    Imaging Review Dg Chest 2 View  10/31/2014   CLINICAL DATA:  Hemoptysis, hypertension, diabetes CHF.  Edema.  EXAM: CHEST  2 VIEW  COMPARISON:  Chest radiograph December 17, 2013  FINDINGS: The cardiac silhouette appears moderately enlarged, unchanged. Mediastinal silhouette is nonsuspicious, status post median sternotomy. Increasing small to moderate RIGHT pleural effusion with underlying consolidation. Mild chronic interstitial changes with strandy densities LEFT lung base. No pneumothorax.  Mild degenerative change of the  thoracic spine. Soft tissue planes are nonsuspicious.  IMPRESSION: Small to moderate RIGHT pleural effusion with underlying consolidation. Recommend follow-up chest radiograph to verify improvement.  Stable cardiomegaly. Interstitial prominence suggest acute or chronic pulmonary edema. LEFT lung base atelectasis.   Electronically Signed   By: Awilda Metro   On: 10/31/2014 21:03     EKG Interpretation   Date/Time:  Thursday October 31 2014 20:23:04 EST Ventricular Rate:  104 PR Interval:  174 QRS Duration: 126 QT Interval:  372 QTC Calculation: 489 R Axis:   -23 Text Interpretation:  Sinus tachycardia Non-specific intra-ventricular  conduction block T wave abnormality, consider inferolateral ischemia  Abnormal ECG LBBB is new Confirmed by Gwendolyn Grant  MD, Dequane Strahan ([id number]) on  10/31/2014 8:31:13 PM      MDM   Final diagnoses:  Aspiration pneumonia, unspecified aspiration pneumonia type  Pleural effusion    81 year old male presents with blood-tinged  sputum. Has been going on for the past few days. No fever, shortness of breath, chest pain. Patient's family is also concerned about him having some choking episodes. It's occasionally with solids but never with liquids. He is drinking liquids fine now. He is on Coumadin does have history of pulmonary embolus, CHF, aortic valve replacement.  Neurologic exam normal here. Since only having dysphagia with some solids, I don't think he needs MRI for possible stroke. He is drinking ensure shakes at home without difficulty. Family brought in evidence of the hemoptysis. Sulci sputum with some blood tinged. He is not having any frank hemoptysis. Will check INR to see if Coumadin is therapeutic, if so, I do not feel he needs a CT scan because this sounds mostly like an infectious source with his blood-tinged sputum rather than PE with frank hemoptysis.  CXR with LLL consolidation and pleural effusion. Antibiotics given. Admitted.  Elwin Mocha,  MD 10/31/14 [phone]

## 2014-10-31 NOTE — Progress Notes (Signed)
Pt admitted to room 501 alert and oriented x 4 moe x4. No skin breakdown noted iv patent. Pt and wife live with daughter Omar Walker durin the winter and live in [place] during the summers. Full code vss

## 2014-10-31 NOTE — Telephone Encounter (Signed)
     Kendal HymenBonnie, patients daughter called because patient choking on food and coughing up blood tinged sputum. Patient on chronic coumadin for afib. Was placed on lasix 40mg  yesterday and has not peed at all. She thinks he is volume overloaded. I advised her to bring him to the ED for further evaluation.   Cline CrockKathryn Cyann Venti PA-C  [facility]

## 2014-11-01 ENCOUNTER — Inpatient Hospital Stay (HOSPITAL_COMMUNITY): Payer: Medicare Other

## 2014-11-01 ENCOUNTER — Encounter (HOSPITAL_COMMUNITY): Payer: Self-pay | Admitting: Internal Medicine

## 2014-11-01 LAB — LEGIONELLA ANTIGEN, URINE

## 2014-11-01 LAB — CBC WITH DIFFERENTIAL/PLATELET
BASOS PCT: 1 % (ref 0–1)
Basophils Absolute: 0.1 10*3/uL (ref 0.0–0.1)
EOS ABS: 0 10*3/uL (ref 0.0–0.7)
EOS PCT: 0 % (ref 0–5)
HCT: 39 % (ref 39.0–52.0)
Hemoglobin: 12.5 g/dL — ABNORMAL LOW (ref 13.0–17.0)
Lymphocytes Relative: 8 % — ABNORMAL LOW (ref 12–46)
Lymphs Abs: 0.9 10*3/uL (ref 0.7–4.0)
MCH: 28.2 pg (ref 26.0–34.0)
MCHC: 32.1 g/dL (ref 30.0–36.0)
MCV: 87.8 fL (ref 78.0–100.0)
MONO ABS: 1.4 10*3/uL — AB (ref 0.1–1.0)
Monocytes Relative: 12 % (ref 3–12)
NEUTROS ABS: 9.2 10*3/uL — AB (ref 1.7–7.7)
Neutrophils Relative %: 79 % — ABNORMAL HIGH (ref 43–77)
Platelets: 210 10*3/uL (ref 150–400)
RBC: 4.44 MIL/uL (ref 4.22–5.81)
RDW: 18.7 % — AB (ref 11.5–15.5)
WBC: 11.6 10*3/uL — ABNORMAL HIGH (ref 4.0–10.5)

## 2014-11-01 LAB — COMPREHENSIVE METABOLIC PANEL
ALT: 8 U/L (ref 0–53)
ANION GAP: 11 (ref 5–15)
AST: 26 U/L (ref 0–37)
Albumin: 3.2 g/dL — ABNORMAL LOW (ref 3.5–5.2)
Alkaline Phosphatase: 71 U/L (ref 39–117)
BUN: 27 mg/dL — ABNORMAL HIGH (ref 6–23)
CO2: 23 mmol/L (ref 19–32)
Calcium: 8.6 mg/dL (ref 8.4–10.5)
Chloride: 98 mmol/L (ref 96–112)
Creatinine, Ser: 1.42 mg/dL — ABNORMAL HIGH (ref 0.50–1.35)
GFR calc Af Amer: 51 mL/min — ABNORMAL LOW (ref >90)
GFR, EST NON AFRICAN AMERICAN: 44 mL/min — AB (ref >90)
Glucose, Bld: 111 mg/dL — ABNORMAL HIGH (ref 70–99)
POTASSIUM: 3.8 mmol/L (ref 3.5–5.1)
SODIUM: 132 mmol/L — AB (ref 135–145)
TOTAL PROTEIN: 6 g/dL (ref 6.0–8.3)
Total Bilirubin: 1.6 mg/dL — ABNORMAL HIGH (ref 0.3–1.2)

## 2014-11-01 LAB — LIPID PANEL
Cholesterol: 155 mg/dL (ref 0–200)
HDL: 27 mg/dL — ABNORMAL LOW (ref >39)
LDL CALC: 114 mg/dL — AB (ref 0–99)
Total CHOL/HDL Ratio: 5.7 RATIO
Triglycerides: 72 mg/dL (ref <150)
VLDL: 14 mg/dL (ref 0–40)

## 2014-11-01 LAB — GLUCOSE, CAPILLARY
GLUCOSE-CAPILLARY: 99 mg/dL (ref 70–99)
Glucose-Capillary: 109 mg/dL — ABNORMAL HIGH (ref 70–99)
Glucose-Capillary: 117 mg/dL — ABNORMAL HIGH (ref 70–99)
Glucose-Capillary: 127 mg/dL — ABNORMAL HIGH (ref 70–99)
Glucose-Capillary: 140 mg/dL — ABNORMAL HIGH (ref 70–99)
Glucose-Capillary: 169 mg/dL — ABNORMAL HIGH (ref 70–99)
Glucose-Capillary: 99 mg/dL (ref 70–99)

## 2014-11-01 LAB — STREP PNEUMONIAE URINARY ANTIGEN: Strep Pneumo Urinary Antigen: NEGATIVE

## 2014-11-01 LAB — PROTIME-INR
INR: 4.61 — ABNORMAL HIGH (ref 0.00–1.49)
Prothrombin Time: 43.9 seconds — ABNORMAL HIGH (ref 11.6–15.2)

## 2014-11-01 MED ORDER — CHLORHEXIDINE GLUCONATE 0.12 % MT SOLN
15.0000 mL | Freq: Two times a day (BID) | OROMUCOSAL | Status: DC
  Administered 2014-11-01 – 2014-11-04 (×7): 15 mL via OROMUCOSAL
  Filled 2014-11-01 (×10): qty 15

## 2014-11-01 MED ORDER — FUROSEMIDE 10 MG/ML IJ SOLN
40.0000 mg | Freq: Once | INTRAMUSCULAR | Status: AC
  Administered 2014-11-01: 40 mg via INTRAVENOUS
  Filled 2014-11-01: qty 4

## 2014-11-01 MED ORDER — FUROSEMIDE 10 MG/ML IJ SOLN
40.0000 mg | Freq: Two times a day (BID) | INTRAMUSCULAR | Status: DC
  Administered 2014-11-02: 40 mg via INTRAVENOUS
  Filled 2014-11-01 (×3): qty 4

## 2014-11-01 MED ORDER — SACCHAROMYCES BOULARDII 250 MG PO CAPS
250.0000 mg | ORAL_CAPSULE | Freq: Two times a day (BID) | ORAL | Status: DC
  Administered 2014-11-01 – 2014-11-04 (×7): 250 mg via ORAL
  Filled 2014-11-01 (×10): qty 1

## 2014-11-01 MED ORDER — ONDANSETRON HCL 4 MG/2ML IJ SOLN
4.0000 mg | Freq: Four times a day (QID) | INTRAMUSCULAR | Status: DC | PRN
  Administered 2014-11-01: 4 mg via INTRAVENOUS
  Filled 2014-11-01: qty 2

## 2014-11-01 MED ORDER — CETYLPYRIDINIUM CHLORIDE 0.05 % MT LIQD
7.0000 mL | Freq: Two times a day (BID) | OROMUCOSAL | Status: DC
  Administered 2014-11-01 – 2014-11-03 (×2): 7 mL via OROMUCOSAL

## 2014-11-01 NOTE — Progress Notes (Signed)
Utilization review completed. Aspin Palomarez, RN, BSN. 

## 2014-11-01 NOTE — Progress Notes (Signed)
Mr. Omar Walker is nauseated this am and no prn's scheduled.  Text paged Dr. Malachi BondsShort.  Will continue to monitor and await for return call or new orders.  Forbes Cellarelcine Graysen Depaula, RN

## 2014-11-01 NOTE — Progress Notes (Signed)
OT Cancellation Note  Patient Details Name: Omar EdenRobert G Walker MRN: [id number] DOB: 03/18/1928   Cancelled Treatment:    Reason Eval/Treat Not Completed: Medical issues which prohibited therapy Pt with elevated INR (4.61) and warfarin still held at this time. Acute OT will hold until INR in therapeutic range. Acute OT will continue to monitor for appropriateness.   Nena JordanMiller, Vito Beg M   Carney LivingLeeAnn Marie Chisa Kushner, OTR/L Occupational Therapist [phone] (pager)  11/01/2014, 8:13 AM

## 2014-11-01 NOTE — Progress Notes (Signed)
PT Cancellation Note  Patient Details Name: Omar EdenRobert G Walker MRN: [id number] DOB: 01/29/1928   Cancelled Treatment:    Reason Eval/Treat Not Completed: Medical issues which prohibited therapy.Pt with upward trending INR (4.6) and Coumadin currently on hold.  Will hold until INR is in therapeutic range. Will check back.     Cherika Jessie LUBECK 11/01/2014, 8:46 AM

## 2014-11-01 NOTE — Evaluation (Signed)
Clinical/Bedside Swallow Evaluation Patient Details  Name: Omar Walker MRN: [id number] Date of Birth: 12/15/1927  Today's Date: 11/01/2014 Time: SLP Start Time (ACUTE ONLY): 0945 SLP Stop Time (ACUTE ONLY): 1027 SLP Time Calculation (min) (ACUTE ONLY): 42 min  Past Medical History:  Past Medical History  Diagnosis Date  . Coronary artery disease   . Stented coronary artery   . Hypertension   . Aortic valve insufficiency   . CHF (congestive heart failure)   . AF (atrial fibrillation)   . Embolism and thrombosis   . PE (pulmonary embolism)     hx  . H/O cardiac arrest     "3-4 times at least over various years" (12/17/2013)  . H/O aorto-femoral bypass 12/17/2013  . Gout     "a few times"  . Decreased cardiac ejection fraction 12/17/2013  . Aortic valvular disease 12/17/2013  . Elevated troponin level 12/17/2013  . High cholesterol   . DVT, lower extremity   . Type II diabetes mellitus   . History of blood transfusion     "think related to aorto operation"  . GERD (gastroesophageal reflux disease)   . H/O hiatal hernia   . NSTEMI (non-ST elevated myocardial infarction) 2001; 2013    hx/notes 12/17/2013   Past Surgical History:  Past Surgical History  Procedure Laterality Date  . Appendectomy    . Aorta - bilateral femoral artery bypass graft  1990's    "feet had turned black"  . Glaucoma surgery Right     "took cataract off; drilled hole in eyeball to relieve pressure"  . Tibia fracture surgery Right     "just below the knee"  . Cardiac catheterization    . Coronary angioplasty with stent placement  ?2001    "I have a couple stents in my heart"   HPI:  Omar Walker is a 81 y.o. male 's past medical history of hypertension, gout, coronary artery disease, post status of stent placement, post status of CABG, congestive heart failure, atrial fibrillation, history of pulmonary embolism, long-term Coumadin use, history of aortic valve replacement, who presents with  cough.Omar Walker is a 81 y.o. male 's past medical history of hypertension, gout, coronary artery disease, post status of stent placement, post status of CABG, congestive heart failure, atrial fibrillation, history of pulmonary embolism, long-term Coumadin use, history of aortic valve replacement, who presents with cough.  Pt reports difficulty swallowing solids, especially breads, meats, and dry foods.  Denies difficulty with liquids.  Pt states this began about 2-3 weeks ago.  Pt states he has a h/o a hiatal hernia, but denies other known esophageal issues.   Assessment / Plan / Recommendation Clinical Impression  Suspect a primary esophageal dysphagia, per c/o gagging, nausea, and choking with solids, with a possible secondary pharyngeal dysphagia.  Pt did have effortful swallows, multiple swallows on one sip, and throat clearing and occassional cough.  Will proceed with MBS for objective study.  Pt educated and agrees with plan.    Aspiration Risk  Moderate    Diet Recommendation NPO        Other  Recommendations Recommended Consults: MBS   Follow Up Recommendations       Frequency and Duration        Pertinent Vitals/Pain Nausea has subsided for now, per pt report.         Swallow Study Prior Functional Status       General Date of Onset: 10/11/14 HPI: Omar Walker is a  81 y.o. male 's past medical history of hypertension, gout, coronary artery disease, post status of stent placement, post status of CABG, congestive heart failure, atrial fibrillation, history of pulmonary embolism, long-term Coumadin use, history of aortic valve replacement, who presents with cough.Omar EdenRobert G Walker is a 81 y.o. male 's past medical history of hypertension, gout, coronary artery disease, post status of stent placement, post status of CABG, congestive heart failure, atrial fibrillation, history of pulmonary embolism, long-term Coumadin use, history of aortic valve replacement, who presents with  cough.  Pt reports difficulty swallowing solids, especially breads, meats, and dry foods.  Denies difficulty with liquids.  Pt states this began about 2-3 weeks ago.  Pt states he has a h/o a hiatal hernia, but denies other known esophageal issues. Type of Study: Bedside swallow evaluation Previous Swallow Assessment: none Diet Prior to this Study: NPO Temperature Spikes Noted: Yes Respiratory Status: Room air History of Recent Intubation: No Behavior/Cognition: Alert;Cooperative;Pleasant mood;Hard of hearing Self-Feeding Abilities: Able to feed self Patient Positioning: Upright in bed Baseline Vocal Quality: Clear Volitional Cough: Congested Volitional Swallow: Able to elicit    Oral/Motor/Sensory Function Overall Oral Motor/Sensory Function: Appears within functional limits for tasks assessed   Ice Chips Ice chips: Within functional limits Presentation: Spoon   Thin Liquid Thin Liquid: Impaired Presentation: Cup;Straw Pharyngeal  Phase Impairments: Decreased hyoid-laryngeal movement;Multiple swallows;Throat Clearing - Immediate;Cough - Delayed    Nectar Thick Nectar Thick Liquid: Not tested   Honey Thick Honey Thick Liquid: Not tested   Puree Puree: Not tested   Solid   GO    Solid: Not tested       Omar RochesterWillis, Neale Marzette T 11/01/2014,10:28 AM

## 2014-11-01 NOTE — Progress Notes (Signed)
ANTICOAGULATION CONSULT NOTE - Follow-up  Pharmacy Consult for Warfarin Indication: atrial fibrillation and pulmonary embolus  Allergies  Allergen Reactions  . Milk-Related Compounds     Can tolerate ice creams.     Patient Measurements: Height: 5\' 10"  (177.8 cm) Weight: 178 lb 8 oz (80.967 kg) IBW/kg (Calculated) : 73  Vital Signs: Temp: 97.5 F (36.4 C) (02/26 0608) Temp Source: Oral (02/26 0608) BP: 130/88 mmHg (02/26 0528) Pulse Rate: 103 (02/25 2215)  Labs:  Recent Labs  10/31/14 2019 11/01/14 0550  HGB 13.1 12.5*  HCT 41.5 39.0  PLT 208 210  LABPROT 39.3* 43.9*  INR 4.01* 4.61*  CREATININE 1.42* 1.42*    Estimated Creatinine Clearance: 40 mL/min (by C-G formula based on Cr of 1.42).  Assessment: 81yo male with history of Afib, PE/DVT, cardiac arrest 12/2013, and CAD presents with hemoptysis and edema. Pt continues on chronic coumadin for history of afib, PE and DVT. No new bleeding noted. H/H stable, platelets are WNL. INR remains elevated at 4.61.  Warfarin PTA dose: 5mg  Tues, Wed, Sat and 2.5mg  AODs  Goal of Therapy:  INR 2-3   Plan:  1. No coumadin tonight 2. F/u AM INR  Lysle Pearlachel Natalynn Pedone, PharmD, BCPS Pager # [phone] 11/01/2014 8:39 AM

## 2014-11-01 NOTE — Procedures (Signed)
Objective Swallowing Evaluation: Modified Barium Swallowing Study  Patient Details  Name: Omar Walker MRN: [id number] Date of Birth: 12/18/1927  Today's Date: 11/01/2014 Time: SLP Start Time (ACUTE ONLY): 1058-SLP Stop Time (ACUTE ONLY): 1130 SLP Time Calculation (min) (ACUTE ONLY): 32 min  Past Medical History:  Past Medical History  Diagnosis Date  . Coronary artery disease   . Stented coronary artery   . Hypertension   . Aortic valve insufficiency   . CHF (congestive heart failure)   . AF (atrial fibrillation)   . Embolism and thrombosis   . PE (pulmonary embolism)     hx  . H/O cardiac arrest     "3-4 times at least over various years" (12/17/2013)  . H/O aorto-femoral bypass 12/17/2013  . Gout     "a few times"  . Decreased cardiac ejection fraction 12/17/2013  . Aortic valvular disease 12/17/2013  . Elevated troponin level 12/17/2013  . High cholesterol   . DVT, lower extremity   . Type II diabetes mellitus   . History of blood transfusion     "think related to aorto operation"  . GERD (gastroesophageal reflux disease)   . H/O hiatal hernia   . NSTEMI (non-ST elevated myocardial infarction) 2001; 2013    hx/notes 12/17/2013   Past Surgical History:  Past Surgical History  Procedure Laterality Date  . Appendectomy    . Aorta - bilateral femoral artery bypass graft  1990's    "feet had turned black"  . Glaucoma surgery Right     "took cataract off; drilled hole in eyeball to relieve pressure"  . Tibia fracture surgery Right     "just below the knee"  . Cardiac catheterization    . Coronary angioplasty with stent placement  ?2001    "I have a couple stents in my heart"   HPI:  HPI: Omar Walker is a 81 y.o. male 's past medical history of hypertension, gout, coronary artery disease, post status of stent placement, post status of CABG, congestive heart failure, atrial fibrillation, history of pulmonary embolism, long-term Coumadin use, history of aortic valve  replacement, who presents with cough.Omar Walker is a 81 y.o. male 's past medical history of hypertension, gout, coronary artery disease, post status of stent placement, post status of CABG, congestive heart failure, atrial fibrillation, history of pulmonary embolism, long-term Coumadin use, history of aortic valve replacement, who presents with cough.  Pt reports difficulty swallowing solids, especially breads, meats, and dry foods.  Denies difficulty with liquids.  Pt states this began about 2-3 weeks ago.  Pt states he has a h/o a hiatal hernia, but denies other known esophageal issues.  No Data Recorded  Assessment / Plan / Recommendation CHL IP CLINICAL IMPRESSIONS 11/01/2014  Dysphagia Diagnosis WFL  Clinical impression Pt demonstrated normal swallow function under fluroscopy.  There was no aspiration or penetration, and only one instance of mild vallecular residue with cracker that pt spontaneously cleared.  All consistencies cleared the esophagus without difficulty and without delay.  Pt did cough intermitently during the study, but there was nothing visulaized in the airway.  Should symptoms persist, consider GI w/u.      CHL IP TREATMENT RECOMMENDATION 11/01/2014  Treatment Plan Recommendations No treatment recommended at this time     CHL IP DIET RECOMMENDATION 11/01/2014  Diet Recommendations Dysphagia 3 (Mechanical Soft);Thin liquid  Liquid Administration via Cup;Straw  Medication Administration Whole meds with liquid  Compensations Slow rate;Small sips/bites;Follow solids with liquid  Postural  Changes and/or Swallow Maneuvers Seated upright 90 degrees;Upright 30-60 min after meal     CHL IP OTHER RECOMMENDATIONS 11/01/2014  Recommended Consults Consider GI evaluation  Oral Care Recommendations Oral care BID  Other Recommendations Clarify dietary restrictions     No flowsheet data found.   No flowsheet data found.   Pertinent Vitals/Pain n/a     No flowsheet data found.     CHL IP REASON FOR REFERRAL 11/01/2014  Reason for Referral Objectively evaluate swallowing function     CHL IP ORAL PHASE 11/01/2014  Lips (None)  Tongue (None)  Mucous membranes (None)  Nutritional status (None)  Other (None)  Oxygen therapy (None)  Oral Phase WFL  Oral - Pudding Teaspoon (None)  Oral - Pudding Cup (None)  Oral - Honey Teaspoon (None)  Oral - Honey Cup (None)  Oral - Honey Syringe (None)  Oral - Nectar Teaspoon (None)  Oral - Nectar Cup (None)  Oral - Nectar Straw (None)  Oral - Nectar Syringe (None)  Oral - Ice Chips (None)  Oral - Thin Teaspoon (None)  Oral - Thin Cup (None)  Oral - Thin Straw (None)  Oral - Thin Syringe (None)  Oral - Puree (None)  Oral - Mechanical Soft (None)  Oral - Regular (None)  Oral - Multi-consistency (None)  Oral - Pill (None)  Oral Phase - Comment (None)      CHL IP PHARYNGEAL PHASE 11/01/2014  Pharyngeal Phase WFL  Pharyngeal - Pudding Teaspoon (None)  Penetration/Aspiration details (pudding teaspoon) (None)  Pharyngeal - Pudding Cup (None)  Penetration/Aspiration details (pudding cup) (None)  Pharyngeal - Honey Teaspoon (None)  Penetration/Aspiration details (honey teaspoon) (None)  Pharyngeal - Honey Cup (None)  Penetration/Aspiration details (honey cup) (None)  Pharyngeal - Honey Syringe (None)  Penetration/Aspiration details (honey syringe) (None)  Pharyngeal - Nectar Teaspoon (None)  Penetration/Aspiration details (nectar teaspoon) (None)  Pharyngeal - Nectar Cup (None)  Penetration/Aspiration details (nectar cup) (None)  Pharyngeal - Nectar Straw (None)  Penetration/Aspiration details (nectar straw) (None)  Pharyngeal - Nectar Syringe (None)  Penetration/Aspiration details (nectar syringe) (None)  Pharyngeal - Ice Chips (None)  Penetration/Aspiration details (ice chips) (None)  Pharyngeal - Thin Teaspoon (None)  Penetration/Aspiration details (thin teaspoon) (None)  Pharyngeal - Thin Cup (None)   Penetration/Aspiration details (thin cup) (None)  Pharyngeal - Thin Straw (None)  Penetration/Aspiration details (thin straw) (None)  Pharyngeal - Thin Syringe (None)  Penetration/Aspiration details (thin syringe') (None)  Pharyngeal - Puree (None)  Penetration/Aspiration details (puree) (None)  Pharyngeal - Mechanical Soft (None)  Penetration/Aspiration details (mechanical soft) (None)  Pharyngeal - Regular (None)  Penetration/Aspiration details (regular) (None)  Pharyngeal - Multi-consistency (None)  Penetration/Aspiration details (multi-consistency) (None)  Pharyngeal - Pill (None)  Penetration/Aspiration details (pill) (None)  Pharyngeal Comment (None)     CHL IP CERVICAL ESOPHAGEAL PHASE 11/01/2014  Cervical Esophageal Phase WFL  Pudding Teaspoon (None)  Pudding Cup (None)  Honey Teaspoon (None)  Honey Cup (None)  Honey Syringe (None)  Nectar Teaspoon (None)  Nectar Cup (None)  Nectar Straw (None)  Nectar Syringe (None)  Thin Teaspoon (None)  Thin Cup (None)  Thin Straw (None)  Thin Syringe (None)  Cervical Esophageal Comment (None)    No flowsheet data found.         Maryjo Rochester T 11/01/2014, 11:39 AM

## 2014-11-01 NOTE — Clinical Documentation Improvement (Signed)
"  CHF" documented in current chart.  Please provide the acuity (chronic, acute, acute on chronic) and type of CHF (systolic, diastolic, combined systolic and diastolic)  involved in this admission, if known,  in your progress note and carry over to the discharge summary.    Thank you, Doy MinceVangela Ebin Palazzi, RN [phone] Clinical Documentation Specialist

## 2014-11-01 NOTE — Progress Notes (Signed)
[facility] PROGRESS NOTE  Omar Walker:[id number] DOB: [birth year] DOA: 10/31/2014 PCP: Quintella Reichert, MD  Brief Summary  Omar Walker is a 81 y.o. Male with past medical history of hypertension, gout, coronary artery disease, post status of stent placement, post status of CABG, congestive heart failure, atrial fibrillation, history of pulmonary embolism, long-term Coumadin use, history of aortic valve replacement, who presents with cough x 3-5 days productive of pinkish and jellylike sputum. He denied fever, chills, shortness of breath, chest pain.  He also reported choking with food during the last week. He was okay with fluid. No family history of esophageal cancer, but his mother had esophageal stricture. He denied unilateral weakness, numbness or tingling sensations. Patient denied fever, chills, headaches.  In ED, patient was found to have INR 4.01, BNP 925, negative troponin, no leukocytosis, temperature normal. Chest x-ray showed small to moderate RIGHT pleural effusion with underlying consolidation.  He was taken off his spironolactone about a month ago and he gained 10-lbs and developed abdominal swelling so it was restarted a few days ago.     Assessment/Plan  Aspiration pneumonia: Patient's productive cough with jelly like sputum production is most likely caused by aspiration pneumonia.  Patient recently has difficulty swallowing, putting patient at high risk of aspiration. Currently patient does not have a sepsis. Hemodynamically stable. - IV Clindamycin - Urine legionella negative and S. pneumococcal antigen negative - Albuterol Nebulizer prn for SOB - Blood culture x2 NGTD - Sputum culture to be drawn -  IS/left lateral decub/ pulmonary toilet -  CT chest to evaluate for chest masses contributing to dysphagia and to see if there are any lingering food particles in right bronchioles  Dysphagia:  Okay with liquids, but solid foods get stuck in chest and get  regurgitated shortly thereafter.  He "vomits" or regurgitates frequently. Differential diagnoses include esophageal stricture, and esophageal cancer. -  Appreciate speech assistance  -  MBS without significant aspiration, but then choked on soft carrot when he got back to his room and may have aspirated yet again -  Dysphagia 3 with thin -  Esophagram ordered -  Contacted GI for possible EGD >> Note:  INR is supratherapeutic, so he would not be able to have any procedures until sometime next week at earliest, but would be easier here because of his high CHADS2vasc score, PE, and severe heart failure.  -  GI will see him in consultation after his esophagram is complete and once his INR has come down some.   Acute on chronic congestive heart failure: 2-D echo on 10/23/14 showed EF 15-20%.  BNP 925, 10-lb weight gain and abdominal distension -  Place on telemetry -  Start lasix  BID -  continue spironolactone -  Daily weights, strict I/O -  Low salt diet -  Will notify cardiology about admission  P Afib, currently in SR -  CHADS2vasc 4, on A/C  AV insufficiency  Pulmonary embolism and DVT: patient is on Coumadin, with supratherapeutic INR at 4.01. Patient's productive cough does not have bright red colored blood as I personally saw the sputum. - coumadin on hold due to elevated INR  Diabetes mellitus: Patient is on Levemir15 units at home. - continue Levemir dose to 10 units daily -Sliding-scale insulin  Coronary artery disease: Post status of the stent placement and CABG, hx of cardiac arrest multiple times. no chest pain currently. -continue aspirin, Coreg,  CKD stage III -  Minimize nephrotoxins -  Renally dose medications  Diet:  Dysphagia 3, fluid restricted Access:  PIV IVF:  off Proph:  supratherapeutic INR  Code Status: full Family Communication: patient, his wife, and his daughter Disposition Plan: pending    Consultants:  GI, Dr. Bosie Clos.     Procedures:  MBS  Antibiotics:  Clindamycin 2/25 >>   HPI/Subjective:  Feels fine.  Denies fevers, chills, SOB, chest pain  Objective: Filed Vitals:   11/01/14 0100 11/01/14 0528 11/01/14 0608 11/01/14 1357  BP:  130/88  119/72  Pulse:    92  Temp:   97.5 F (36.4 C) 97.7 F (36.5 C)  TempSrc:   Oral Oral  Resp:  20  16  Height:  (1.778 m)     Weight: 80.967 kg (178 lb 8 oz)     SpO2:  96%  96%    Intake/Output Summary (Last 24 hours) at 11/01/14 1521 Last data filed at 11/01/14 1000  Gross per 24 hour  Intake 590.83 ml  Output    300 ml  Net 290.83 ml   Filed Weights   11/01/14 0100  Weight: 80.967 kg (178 lb 8 oz)    Exam:   General:  Thin male, mild respiratory distress with SCM retractions  HEENT:  NCAT, MMM  Cardiovascular:  RRR, nl s1, soft s2, 2/6 systolic murmur RSB, 2+ pulses, warm extremities  Respiratory:  diminsihed right anterior chest and right posterior chest, no rhonchi, rales, mild tachypnea, SCM retractions, occasional intercostal retractions  Abdomen:   NABS, soft, moderately distended, nontender  MSK:   Normal tone and bulk, trace nonpitting edema bilateral LEE  Neuro:  Grossly intact, mild tongue deviation to the right  Data Reviewed: Basic Metabolic Panel:  Recent Labs Lab 10/31/14 2019 11/01/14 0550  NA 133* 132*  K 4.2 3.8  CL 98 98  CO2 22 23  GLUCOSE 160* 111*  BUN 26* 27*  CREATININE 1.42* 1.42*  CALCIUM 8.9 8.6   Liver Function Tests:  Recent Labs Lab 11/01/14 0550  AST 26  ALT 8  ALKPHOS 71  BILITOT 1.6*  PROT 6.0  ALBUMIN 3.2*   No results for input(s): LIPASE, AMYLASE in the last 168 hours. No results for input(s): AMMONIA in the last 168 hours. CBC:  Recent Labs Lab 10/31/14 2019 11/01/14 0550  WBC 10.5 11.6*  NEUTROABS 8.6* 9.2*  HGB 13.1 12.5*  HCT 41.5 39.0  MCV 91.4 87.8  PLT 208 210   Cardiac Enzymes: No results for input(s): CKTOTAL, CKMB, CKMBINDEX, TROPONINI in the  last 168 hours. BNP (last 3 results)  Recent Labs  10/31/14 2020  BNP 925.8*    ProBNP (last 3 results)  Recent Labs  12/17/13 0358 12/18/13 0552 01/02/14 0958  PROBNP 2955.0* 3688.0* 368.0*    CBG:  Recent Labs Lab 11/01/14 0029 11/01/14 0419 11/01/14 0833 11/01/14 1248  GLUCAP 99 109* 99 127*    Recent Results (from the past 240 hour(s))  Culture, blood (routine x 2)     Status: None (Preliminary result)   Collection Time: 10/31/14  9:40 PM  Result Value Ref Range Status   Specimen Description BLOOD LEFT ARM  Final   Special Requests BOTTLES DRAWN AEROBIC AND ANAEROBIC 10CC  Final   Culture PENDING  Incomplete   Report Status PENDING  Incomplete     Studies: Dg Chest 2 View  10/31/2014   CLINICAL DATA:  Hemoptysis, hypertension, diabetes CHF.  Edema.  EXAM: CHEST  2 VIEW  COMPARISON:  Chest radiograph December 17, 2013  FINDINGS: The cardiac silhouette appears moderately enlarged, unchanged. Mediastinal silhouette is nonsuspicious, status post median sternotomy. Increasing small to moderate RIGHT pleural effusion with underlying consolidation. Mild chronic interstitial changes with strandy densities LEFT lung base. No pneumothorax.  Mild degenerative change of the thoracic spine. Soft tissue planes are nonsuspicious.  IMPRESSION: Small to moderate RIGHT pleural effusion with underlying consolidation. Recommend follow-up chest radiograph to verify improvement.  Stable cardiomegaly. Interstitial prominence suggest acute or chronic pulmonary edema. LEFT lung base atelectasis.   Electronically Signed   By: Awilda Metroourtnay  Bloomer   On: 10/31/2014 21:03    Scheduled Meds: . allopurinol  150 mg Oral Daily  . antiseptic oral rinse  7 mL Mouth Rinse q12n4p  . aspirin EC  81 mg Oral Daily  . carvedilol  12.5 mg Oral BID WC  . chlorhexidine  15 mL Mouth Rinse BID  . clindamycin (CLEOCIN) IV  600 mg Intravenous 3 times per day  . digoxin  0.125 mg Oral Daily  . insulin aspart  0-9  Units Subcutaneous TID WC  . insulin aspart  10 Units Subcutaneous TID AC  . insulin detemir  10 Units Subcutaneous Daily  . PARoxetine  10 mg Oral Daily  . saccharomyces boulardii  250 mg Oral BID  . spironolactone  25 mg Oral Daily   Continuous Infusions: . sodium chloride 50 mL/hr at 11/01/14 0031    Principal Problem:   Aspiration pneumonia Active Problems:   CHF (congestive heart failure)   PE (pulmonary embolism)   H/O aorto-femoral bypass   Coronary artery disease   Diabetes mellitus   Gout   Aortic valvular disease   Diabetes mellitus without complication   Essential hypertension   Difficulty swallowing    Time spent: 30 min    Omar Walker, Omar Walker  [facility] Pager [phone]. If 7PM-7AM, please contact night-coverage at [email or web address], password [email or web address] 11/01/2014, 3:21 PM  LOS: 1 day

## 2014-11-02 ENCOUNTER — Telehealth: Payer: Self-pay | Admitting: Physician Assistant

## 2014-11-02 ENCOUNTER — Inpatient Hospital Stay (HOSPITAL_COMMUNITY): Payer: Medicare Other

## 2014-11-02 DIAGNOSIS — N183 Chronic kidney disease, stage 3 unspecified: Secondary | ICD-10-CM

## 2014-11-02 DIAGNOSIS — I2510 Atherosclerotic heart disease of native coronary artery without angina pectoris: Secondary | ICD-10-CM

## 2014-11-02 DIAGNOSIS — I5023 Acute on chronic systolic (congestive) heart failure: Secondary | ICD-10-CM

## 2014-11-02 DIAGNOSIS — I359 Nonrheumatic aortic valve disorder, unspecified: Secondary | ICD-10-CM

## 2014-11-02 LAB — HEMOGLOBIN A1C
Hgb A1c MFr Bld: 7 % — ABNORMAL HIGH (ref 4.8–5.6)
Mean Plasma Glucose: 154 mg/dL

## 2014-11-02 LAB — GLUCOSE, CAPILLARY
GLUCOSE-CAPILLARY: 172 mg/dL — AB (ref 70–99)
GLUCOSE-CAPILLARY: 59 mg/dL — AB (ref 70–99)
GLUCOSE-CAPILLARY: 146 mg/dL — AB (ref 70–99)
Glucose-Capillary: 96 mg/dL (ref 70–99)
Glucose-Capillary: 98 mg/dL (ref 70–99)
Glucose-Capillary: 78 mg/dL (ref 70–99)

## 2014-11-02 LAB — BASIC METABOLIC PANEL
Anion gap: 9 (ref 5–15)
BUN: 36 mg/dL — AB (ref 6–23)
CALCIUM: 8.6 mg/dL (ref 8.4–10.5)
CHLORIDE: 99 mmol/L (ref 96–112)
CO2: 24 mmol/L (ref 19–32)
Creatinine, Ser: 1.97 mg/dL — ABNORMAL HIGH (ref 0.50–1.35)
GFR calc Af Amer: 34 mL/min — ABNORMAL LOW (ref >90)
GFR, EST NON AFRICAN AMERICAN: 29 mL/min — AB (ref >90)
Glucose, Bld: 92 mg/dL (ref 70–99)
Potassium: 4.5 mmol/L (ref 3.5–5.1)
Sodium: 132 mmol/L — ABNORMAL LOW (ref 135–145)

## 2014-11-02 LAB — PROTIME-INR
INR: 5.67 — AB (ref 0.00–1.49)
Prothrombin Time: 51.6 seconds — ABNORMAL HIGH (ref 11.6–15.2)

## 2014-11-02 LAB — CBC
HCT: 39.4 % (ref 39.0–52.0)
HEMOGLOBIN: 12.5 g/dL — AB (ref 13.0–17.0)
MCH: 28.4 pg (ref 26.0–34.0)
MCHC: 31.7 g/dL (ref 30.0–36.0)
MCV: 89.5 fL (ref 78.0–100.0)
Platelets: 233 10*3/uL (ref 150–400)
RBC: 4.4 MIL/uL (ref 4.22–5.81)
RDW: 18.8 % — ABNORMAL HIGH (ref 11.5–15.5)
WBC: 8.3 10*3/uL (ref 4.0–10.5)

## 2014-11-02 MED ORDER — FUROSEMIDE 10 MG/ML IJ SOLN
80.0000 mg | Freq: Two times a day (BID) | INTRAMUSCULAR | Status: DC
  Administered 2014-11-02 – 2014-11-04 (×4): 80 mg via INTRAVENOUS
  Filled 2014-11-02 (×5): qty 8

## 2014-11-02 MED ORDER — PHYTONADIONE 5 MG PO TABS
5.0000 mg | ORAL_TABLET | Freq: Once | ORAL | Status: AC
  Administered 2014-11-02: 5 mg via ORAL
  Filled 2014-11-02 (×2): qty 1

## 2014-11-02 NOTE — Consult Note (Signed)
Admit date: 10/31/2014 Referring Physician  Dr. Malachi Bonds Primary Physician Quintella Reichert, MD Primary Cardiologist  Dr. Mayford Knife Reason for Consultation  CHF  HPI: 81 year old male with coronary artery disease status post bypass surgery with aortic valve replacement in [place] with chronic kidney disease stage III, severely reduced ejection fraction of 15%, history of pulmonary embolism on chronic long-term Coumadin, paroxysmal atrial flutter who presented to the hospital on 10/31/14 with cough over the last 3-5 days, jellylike sputum without chills, fevers. Moderate right pleural effusion was noted on chest x-ray with consolidation. He was originally given IV fluids but this has subsequently been stopped. He also reported difficulty swallowing. Lasix was held originally but restarted at 40 mg twice a day with minimal urine output over the past 24 hours. Aldactone had been discontinued. Lack of appetite noted.  There was equivocal a she and regarding CODE STATUS, originally patient reported to not resuscitate but after discussion with family/wife, they wanted everything to be done or at least tried. We had lengthy discussion regarding this. Family would ultimately like to discuss this amongst themselves. They understand his overall poor prognosis.    PMH:   Past Medical History  Diagnosis Date  . Coronary artery disease   . Stented coronary artery   . Hypertension   . Aortic valve insufficiency   . CHF (congestive heart failure)   . AF (atrial fibrillation)   . Embolism and thrombosis   . PE (pulmonary embolism)     hx  . H/O cardiac arrest     "3-4 times at least over various years" (12/17/2013)  . H/O aorto-femoral bypass 12/17/2013  . Gout     "a few times"  . Decreased cardiac ejection fraction 12/17/2013  . Aortic valvular disease 12/17/2013  . Elevated troponin level 12/17/2013  . High cholesterol   . DVT, lower extremity   . Type II diabetes mellitus   . History of blood  transfusion     "think related to aorto operation"  . GERD (gastroesophageal reflux disease)   . H/O hiatal hernia   . NSTEMI (non-ST elevated myocardial infarction) 2001; 2013    hx/notes 12/17/2013    PSH:   Past Surgical History  Procedure Laterality Date  . Appendectomy    . Aorta - bilateral femoral artery bypass graft  1990's    "feet had turned black"  . Glaucoma surgery Right     "took cataract off; drilled hole in eyeball to relieve pressure"  . Tibia fracture surgery Right     "just below the knee"  . Cardiac catheterization    . Coronary angioplasty with stent placement  ?2001    "I have a couple stents in my heart"   Allergies:  Milk-related compounds Prior to Admit Meds:   Prior to Admission medications   Medication Sig Start Date End Date Taking? Authorizing Provider  allopurinol (ZYLOPRIM) 300 MG tablet Take 150 mg by mouth daily.   Yes Historical Provider, MD  aspirin EC 81 MG tablet Take 81 mg by mouth daily.   Yes Historical Provider, MD  carvedilol (COREG) 12.5 MG tablet Take 12.5 mg by mouth 2 (two) times daily with a meal.   Yes Historical Provider, MD  digoxin (LANOXIN) 0.125 MG tablet Take 0.125 mg by mouth daily.   Yes Historical Provider, MD  furosemide (LASIX) 40 MG tablet Take 40 mg by mouth.   Yes Historical Provider, MD  insulin aspart (NOVOLOG) 100 UNIT/ML injection Inject 10 Units into  the skin 3 (three) times daily before meals.   Yes Historical Provider, MD  insulin detemir (LEVEMIR) 100 UNIT/ML injection Inject 15 Units into the skin daily.   Yes Historical Provider, MD  PARoxetine (PAXIL) 10 MG tablet Take 10 mg by mouth daily.   Yes Historical Provider, MD  spironolactone (ALDACTONE) 25 MG tablet Take 25 mg by mouth daily.   Yes Historical Provider, MD  warfarin (COUMADIN) 5 MG tablet Take 2.5-5 mg by mouth See admin instructions. 2.5 mg every day except on Saturday, Tuesday, Wednesday patient takes 5 mg   Yes Historical Provider, MD   Fam HX:    No early family history of CAD Social HX:    History   Social History  . Marital Status: Married    Spouse Name: N/A  . Number of Children: N/A  . Years of Education: N/A   Occupational History  . Not on file.   Social History Main Topics  . Smoking status: Never Smoker   . Smokeless tobacco: Never Used  . Alcohol Use: Yes     Comment: 12/17/2013 "drink 1-2 beers on occasion in the summertime"  . Drug Use: No  . Sexual Activity: No   Other Topics Concern  . Not on file   Social History Narrative     ROS:  All 11 ROS were addressed and are negative except what is stated in the HPI   Physical Exam: Blood pressure 110/50, pulse 87, temperature 97.5 F (36.4 C), temperature source Oral, resp. rate 16, height 5\' 10"  (1.778 m), weight 188 lb 15 oz (85.7 kg), SpO2 94 %.   General: Elderly, comfortable in chair, in no acute distress Head: Eyes PERRLA, No xanthomas.   Multiple seborrheic keratosis. Normal cephalic and atramatic  Lungs:   Decreased breath sounds right lower lobe, no wheezes. Heart:   HRRR S1 S2 Pulses are 2+ & equal. 1/6 systolic murmur, rubs, gallops.  No carotid bruit. No JVD.  No abdominal bruits.  Abdomen: Bowel sounds are positive, abdomen soft and non-tender without masses. No hepatosplenomegaly. Protuberant abdomen Msk:  Back normal. Normal strength and tone for age. Able to sit up. Extremities:  No clubbing, cyanosis or edema.  DP +1 Neuro: Alert and oriented X 3, non-focal, MAE x 4 GU: Deferred Rectal: Deferred Psych:  Good affect, responds appropriately      Labs: Lab Results  Component Value Date   WBC 8.3 11/02/2014   HGB 12.5* 11/02/2014   HCT 39.4 11/02/2014   MCV 89.5 11/02/2014   PLT 233 11/02/2014     Recent Labs Lab 11/01/14 0550 11/02/14 0502  NA 132* 132*  K 3.8 4.5  CL 98 99  CO2 23 24  BUN 27* 36*  CREATININE 1.42* 1.97*  CALCIUM 8.6 8.6  PROT 6.0  --   BILITOT 1.6*  --   ALKPHOS 71  --   ALT 8  --   AST 26  --     GLUCOSE 111* 92   No results for input(s): CKTOTAL, CKMB, TROPONINI in the last 72 hours. Lab Results  Component Value Date   CHOL 155 11/01/2014   HDL 27* 11/01/2014   LDLCALC 114* 11/01/2014   TRIG 72 11/01/2014   BNP 925   Radiology:  Dg Chest 2 View  10/31/2014   CLINICAL DATA:  Hemoptysis, hypertension, diabetes CHF.  Edema.  EXAM: CHEST  2 VIEW  COMPARISON:  Chest radiograph December 17, 2013  FINDINGS: The cardiac silhouette appears moderately enlarged, unchanged.  Mediastinal silhouette is nonsuspicious, status post median sternotomy. Increasing small to moderate RIGHT pleural effusion with underlying consolidation. Mild chronic interstitial changes with strandy densities LEFT lung base. No pneumothorax.  Mild degenerative change of the thoracic spine. Soft tissue planes are nonsuspicious.  IMPRESSION: Small to moderate RIGHT pleural effusion with underlying consolidation. Recommend follow-up chest radiograph to verify improvement.  Stable cardiomegaly. Interstitial prominence suggest acute or chronic pulmonary edema. LEFT lung base atelectasis.   Electronically Signed   By: Awilda Metroourtnay  Bloomer   On: 10/31/2014 21:03   Ct Chest Wo Contrast  11/01/2014   CLINICAL DATA:  Dyspnea.  Dysphagia.  Aspiration.  EXAM: CT CHEST WITHOUT CONTRAST  TECHNIQUE: Multidetector CT imaging of the chest was performed following the standard protocol without IV contrast.  COMPARISON:  Chest radiographs dated 10/31/2014  FINDINGS: Mediastinum/Nodes: Cardiomegaly.  No pericardial effusion.  Three-vessel coronary atherosclerosis. Postsurgical changes related to prior CABG. Atherosclerotic calcifications of the aortic arch.  No suspicious mediastinal or axillary lymphadenopathy.  Visualized thyroid is grossly unremarkable.  Lungs/Pleura: Moderate right pleural effusion.  Associated compressive atelectasis in the right lower lobe. Superimposed right lower lobe pneumonia/aspiration is not entirely excluded.  Additional  multifocal ground-glass opacities in the central left lung (series 205/ images 28, 31, and 36), favored to reflect a mild interstitial edema, less likely multifocal pneumonia.  No suspicious pulmonary nodules.  No pneumothorax.  Upper abdomen: Visualized upper abdomen is notable for vascular calcifications, and nodular hepatic contour, and upper abdominal ascites.  Musculoskeletal: Degenerative changes of the lower cervical and visualized thoracolumbar spine.  IMPRESSION: Cardiomegaly with suspected mild interstitial edema and a moderate right pleural effusion.  Associated compressive atelectasis in the right lower lobe.  Superimposed right lower lobe pneumonia/aspiration is not entirely excluded.   Electronically Signed   By: Charline BillsSriyesh  Krishnan M.D.   On: 11/01/2014 22:30   Personally viewed.  EKG:  10/31/14-sinus rhythm, left bundle branch block. 10/18/14-atrial flutter with left bundle branch block. Personally viewed.   Echocardiogram 10/23/14-ejection fraction 15%, bioprosthetic aortic valve. Mild to moderate tricuspid regurgitation. PA pressure 43 mmHg  ASSESSMENT/PLAN:    81 year old male [place] valve replacement secondary to severe aortic stenosis with severe left ventricular systolic dysfunction, acute on chronic systolic heart failure, left bundle branch block, paroxysmal atrial flutter, chronic kidney disease stage III with creatinines ranging from 1.1-2.2, mild hyponatremia 132.  1. Acute on chronic systolic heart failure-ejection fraction is severely reduced at 15% despite aortic valve replacement and bypass surgery. We had lengthy conversation with wife, family about overall prognosis which they are actually fairly realistic about. Last week, they took a road trip, he was doing quite well and they would like to see him back in that position. They understand that this may be a challenge however. We went over once again CODE STATUS and I explained to  them the rationale behind DO NOT RESUSCITATE. They understand that his chance of survival of cardiac arrest once again is very low. They do however want to discuss this further amongst themselves. I clearly mentioned to them that DO NOT RESUSCITATE does not mean to not treat. We will try more aggressive Lasix therapy with close renal monitoring. He seems to hold on to most of his fluid in his abdomen. There is also a right pleural effusion noted. His nausea/lack of appetite is likely also heart failure related.  Palliative care consult has been enacted by Dr. Malachi BondsShort. I think this will help with family  discussion.  He would also like to go home soon. We will try her best to diuresis him in a timely manner. At this point, quality of life is important for him. We will do our best to maintain adequate volume status.  -I will change Lasix to 80 mg IV twice a day. -Monitor close I:O. -Daily weight  2. Coronary artery disease/aortic valve replacement-stable.  3. Chronic kidney disease stage III-continue to monitor with diuresis. Spironolactone continued, watch for hyperkalemia.  4. Paroxysmal atrial flutter-on chronic Coumadin however no evidence of flutter throughout this admission.  We will follow along with you.  Donato Schultz, MD  11/02/2014  11:31 AM

## 2014-11-02 NOTE — Progress Notes (Signed)
Hypoglycemic Event  CBG: 59  Treatment: 15 GM carbohydrate snack  Symptoms: None  Follow-up CBG: Time:1804 CBG Result:78  Possible Reasons for Event: Inadequate meal intake  Comments/MD notified:    Omar Walker, Omar Walker  Remember to initiate Hypoglycemia Order Set & complete

## 2014-11-02 NOTE — Progress Notes (Signed)
PT Cancellation Note  Patient Details Name: Omar EdenRobert G Walker MRN: [id number] DOB: 04/22/1928   Cancelled Treatment:    Reason Eval/Treat Not Completed: Medical issues which prohibited therapy, INR continuing to trend up.    Teyana Pierron, [place] 11/02/2014, 9:32 AM

## 2014-11-02 NOTE — Progress Notes (Signed)
Called to bedside at the request of the family. They wanted to review CODE STATUS again with the family present. Presented the options of full code versus DO NOT RESUSCITATE again to the patient and the family. The patient deferred the decision to his wife who stated that she would like them to "try at least once."  The patient said "okay."  Discussed again that the patient has multiple medical problems including severe heart failure the prognosis for which is less than 6 months and he is a candidate for hospice. Stated that he really needs to think about what he wants when he dies, does he want to be in the intensive care unit potentially or does he want to try to remain at home as much as possible. Will return his CODE STATUS to full code for now. Advised the patient and his family to discuss the prognosis of his heart failure again with cardiology and I will place a palliative care consult.

## 2014-11-02 NOTE — Progress Notes (Addendum)
[facility] PROGRESS NOTE  DAEVION NAVARETTE ZOX:[id number] DOB: Oct 18, 1927 DOA: 10/31/2014 PCP: Quintella Reichert, MD  Brief Summary  Omar Walker is a 81 y.o. Male with past medical history of hypertension, gout, coronary artery disease, post status of stent placement, post status of CABG, congestive heart failure, atrial fibrillation, history of pulmonary embolism, long-term Coumadin use, history of aortic valve replacement, who presents with cough x 3-5 days productive of pinkish and jellylike sputum. He denied fever, chills, shortness of breath, chest pain.  He also reported choking with food during the last week. He was okay with fluid. No family history of esophageal cancer, but his mother had esophageal stricture. He denied unilateral weakness, numbness or tingling sensations. Patient denied fever, chills, headaches.  In ED, patient was found to have INR 4.01, BNP 925, negative troponin, no leukocytosis, temperature normal. Chest x-ray showed small to moderate RIGHT pleural effusion with underlying consolidation.  He was taken off his spironolactone about a month ago and he gained 10-lbs and developed abdominal swelling so it was restarted a few days ago.     Assessment/Plan  Aspiration pneumonia. Currently patient does not have a sepsis, is not on oxygen and is well-appearing - Convert to oral Clindamycin - Urine legionella negative and S. pneumococcal antigen negative - Albuterol Nebulizer prn for SOB - Blood culture x2 NGTD -  CT chest:  No mass or obvious airway obstruction  Dysphagia:  Okay with liquids, but solid foods get stuck in chest and get regurgitated shortly thereafter.  He "vomits" or regurgitates frequently. Differential diagnoses include esophageal stricture, and esophageal cancer. -  Appreciate speech assistance  -  MBS without significant aspiration, but then choked on soft carrot when he got back to his room and may have aspirated yet again -  Dysphagia 3 with  thin -  Outpatient GI follow up for additional testing.  Soft diet until then.    Acute on chronic congestive heart failure:  COLD/WET.  2-D echo on 10/23/14 showed EF 15-20%.  BNP 925, 10-lb weight gain and abdominal distension.  Despite increased lasix, minimal uop and creatinine rising (suspect cardiorenal syndrome) -  Telemetry:  Mixed a-fib with NSR -  Continue lasix 40mg  BID > minimal uop over last 24 h -  Weights have not been recorded accurately -  continue spironolactone -  Daily weights, strict I/O -  Low salt diet -  Cardiology consulted to assist with medication management - either this gentleman is going to need inotropes soon or go on hospice care.   P Afib, currently in SR -  CHADS2vasc 4, on A/C  Bioprosthetic AV  Pulmonary embolism and DVT,  -  INR still rising and having mild hemoptysis -  Vitamin K 5mg  - coumadin on hold due to elevated INR -  Will need INR check next week   Diabetes mellitus: Patient is on Levemir 15 units at home.  CBG somewhat low for hospitalized patient -  D/c levemir -  Continue SSI with standing aspart 10 units with meals  Coronary artery disease: Post status of the stent placement and CABG, hx of cardiac arrest multiple times. no chest pain currently. -continue aspirin, Coreg,   CKD stage III, creatinine rising somewhat  -  Minimize nephrotoxins -  Renally dose medications  Elevated bilirubin likely from vascular congestion, no abdominal tenderness -  Repeat bilirubin in AM  Diet:  Dysphagia 3, fluid restricted Access:  PIV IVF:  off Proph:  supratherapeutic INR  Code Status:  DNR Family Communication: patient alone and left message for daughter today.  Yesterday, I spoke with the daughter and wife.   Given the severity of his heart failure alone, he is a hospice candidate and his prognosis is poor.  His family, however, is very optimistic and has unrealistic expectations about his health.  "we have been told many times that he  would not make it and here he is."  "He is stronger than you think."  "Do everything possible to keep him alive."  Patient told me today, however, that he would NOT want CPR/chest compressions/shocks/intubation and that he is ready to die when his time comes.  "i have lived a long and good life."  I advised him to speak to his family about his desires.   Disposition Plan:  Patient would like to go home today.  Will await cardiology consultation.  I think he is a great candidate for hospice care, however, he declined at this time.     Consultants:  GI, Dr. Bosie Clos  Cardiology  Procedures:  MBS  CT chest  Antibiotics:  Clindamycin 2/25 >>   HPI/Subjective:  Poor appetite.  Did not choke when eating breakfast today.  Denies pain, SOB.  Chronic swelling in abdomen and ankles.    Objective: Filed Vitals:   11/01/14 2103 11/02/14 0500 11/02/14 0526 11/02/14 0816  BP: 108/56  94/58 110/50  Pulse: 75  72 87  Temp: 97.9 F (36.6 C)  97.5 F (36.4 C)   TempSrc: Oral  Oral   Resp: 18  16   Height:      Weight:  85.7 kg (188 lb 15 oz)    SpO2: 97%  94%     Intake/Output Summary (Last 24 hours) at 11/02/14 0924 Last data filed at 11/02/14 0650  Gross per 24 hour  Intake    330 ml  Output    350 ml  Net    -20 ml   Filed Weights   11/01/14 0100 11/02/14 0500  Weight: 80.967 kg (178 lb 8 oz) 85.7 kg (188 lb 15 oz)    Exam:   General:  Thin male, NAD, HOH  HEENT:  NCAT, MMM  Cardiovascular:  RRR, nl s1, soft s2, 2/6 systolic murmur RSB,1+ pulses, warm extremities  Respiratory:  diminished right anterior chest and right posterior chest, no rhonchi, rales.  Abdomen:   NABS, soft, moderately distended, nontender  MSK:   Normal tone and bulk, 1+ pitting edema bilateral LEE, cold extremities   Neuro:  Grossly intact  Data Reviewed: Basic Metabolic Panel:  Recent Labs Lab 10/31/14 2019 11/01/14 0550 11/02/14 0502  NA 133* 132* 132*  K 4.2 3.8 4.5  CL 98 98 99   CO2 GLUCOSE 160* 111* 92  BUN 26* 27* 36*  CREATININE 1.42* 1.42* 1.97*  CALCIUM 8.9 8.6 8.6   Liver Function Tests:  Recent Labs Lab 11/01/14 0550  AST 26  ALT 8  ALKPHOS 71  BILITOT 1.6*  PROT 6.0  ALBUMIN 3.2*   No results for input(s): LIPASE, AMYLASE in the last 168 hours. No results for input(s): AMMONIA in the last 168 hours. CBC:  Recent Labs Lab 10/31/14 2019 11/01/14 0550 11/02/14 0502  WBC 10.5 11.6* 8.3  NEUTROABS 8.6* 9.2*  --   HGB 13.1 12.5* 12.5*  HCT 41.5 39.0 39.4  MCV 91.4 87.8 89.5  PLT 208 210 233   Cardiac Enzymes: No results for input(s): CKTOTAL, CKMB, CKMBINDEX, TROPONINI in the last  168 hours. BNP (last 3 results)  Recent Labs  10/31/14 2020  BNP 925.8*    ProBNP (last 3 results)  Recent Labs  12/17/13 0358 12/18/13 0552 01/02/14 0958  PROBNP 2955.0* 3688.0* 368.0*    CBG:  Recent Labs Lab 11/01/14 1648 11/01/14 2025 11/01/14 2336 11/02/14 0416 11/02/14 0753  GLUCAP 169* 140* 117* 96 98    Recent Results (from the past 240 hour(s))  Culture, blood (routine x 2)     Status: None (Preliminary result)   Collection Time: 10/31/14  9:40 PM  Result Value Ref Range Status   Specimen Description BLOOD LEFT ARM  Final   Special Requests BOTTLES DRAWN AEROBIC AND ANAEROBIC 10CC  Final   Culture PENDING  Incomplete   Report Status PENDING  Incomplete     Studies: Dg Chest 2 View  10/31/2014   CLINICAL DATA:  Hemoptysis, hypertension, diabetes CHF.  Edema.  EXAM: CHEST  2 VIEW  COMPARISON:  Chest radiograph December 17, 2013  FINDINGS: The cardiac silhouette appears moderately enlarged, unchanged. Mediastinal silhouette is nonsuspicious, status post median sternotomy. Increasing small to moderate RIGHT pleural effusion with underlying consolidation. Mild chronic interstitial changes with strandy densities LEFT lung base. No pneumothorax.  Mild degenerative change of the thoracic spine. Soft tissue planes are  nonsuspicious.  IMPRESSION: Small to moderate RIGHT pleural effusion with underlying consolidation. Recommend follow-up chest radiograph to verify improvement.  Stable cardiomegaly. Interstitial prominence suggest acute or chronic pulmonary edema. LEFT lung base atelectasis.   Electronically Signed   By: Awilda Metroourtnay  Bloomer   On: 10/31/2014 21:03   Ct Chest Wo Contrast  11/01/2014   CLINICAL DATA:  Dyspnea.  Dysphagia.  Aspiration.  EXAM: CT CHEST WITHOUT CONTRAST  TECHNIQUE: Multidetector CT imaging of the chest was performed following the standard protocol without IV contrast.  COMPARISON:  Chest radiographs dated 10/31/2014  FINDINGS: Mediastinum/Nodes: Cardiomegaly.  No pericardial effusion.  Three-vessel coronary atherosclerosis. Postsurgical changes related to prior CABG. Atherosclerotic calcifications of the aortic arch.  No suspicious mediastinal or axillary lymphadenopathy.  Visualized thyroid is grossly unremarkable.  Lungs/Pleura: Moderate right pleural effusion.  Associated compressive atelectasis in the right lower lobe. Superimposed right lower lobe pneumonia/aspiration is not entirely excluded.  Additional multifocal ground-glass opacities in the central left lung (series 205/ images 28, 31, and 36), favored to reflect a mild interstitial edema, less likely multifocal pneumonia.  No suspicious pulmonary nodules.  No pneumothorax.  Upper abdomen: Visualized upper abdomen is notable for vascular calcifications, and nodular hepatic contour, and upper abdominal ascites.  Musculoskeletal: Degenerative changes of the lower cervical and visualized thoracolumbar spine.  IMPRESSION: Cardiomegaly with suspected mild interstitial edema and a moderate right pleural effusion.  Associated compressive atelectasis in the right lower lobe.  Superimposed right lower lobe pneumonia/aspiration is not entirely excluded.   Electronically Signed   By: Charline BillsSriyesh  Krishnan M.D.   On: 11/01/2014 22:30    Scheduled Meds: .  allopurinol  150 mg Oral Daily  . antiseptic oral rinse  7 mL Mouth Rinse q12n4p  . aspirin EC  81 mg Oral Daily  . carvedilol  12.5 mg Oral BID WC  . chlorhexidine  15 mL Mouth Rinse BID  . clindamycin (CLEOCIN) IV  600 mg Intravenous 3 times per day  . digoxin  0.125 mg Oral Daily  . furosemide  40 mg Intravenous BID  . insulin aspart  0-9 Units Subcutaneous TID WC  . insulin aspart  10 Units Subcutaneous TID AC  . insulin detemir  10 Units Subcutaneous Daily  . PARoxetine  10 mg Oral Daily  . saccharomyces boulardii  250 mg Oral BID  . spironolactone  25 mg Oral Daily   Continuous Infusions:    Principal Problem:   Aspiration pneumonia Active Problems:   CHF (congestive heart failure)   PE (pulmonary embolism)   H/O aorto-femoral bypass   Coronary artery disease   Diabetes mellitus   Gout   Aortic valvular disease   Diabetes mellitus without complication   Essential hypertension   Difficulty swallowing    Time spent: 30 min    Annastasia Haskins  [facility] Pager [phone]. If 7PM-7AM, please contact night-coverage at [email or web address], password [facility] 11/02/2014, 9:24 AM  LOS: 2 days

## 2014-11-02 NOTE — Telephone Encounter (Signed)
Called by pt dtr re: CHF  He was admitted 02/28 w/ PNA and also has CHF. She wants cardiology involved.   Advised her to make her wishes known to his attending MD, and they can discuss it with her. She will do so.  Theodore Demarkhonda Barrett, PA-C 11/02/2014 9:12 AM Beeper [phone]

## 2014-11-02 NOTE — Progress Notes (Signed)
CRITICAL VALUE ALERT  Critical value received:  INR 5.67  Date of notification:  11/02/14  Time of notification:  7:39  Critical value read back:Yes.    Nurse who received alert:  Beckey Downingindy Flores   MD notified (1st page):  Short MD   Time of first page:  [id number] am

## 2014-11-03 DIAGNOSIS — R1310 Dysphagia, unspecified: Secondary | ICD-10-CM | POA: Insufficient documentation

## 2014-11-03 LAB — CBC
HCT: 39.3 % (ref 39.0–52.0)
HEMOGLOBIN: 12.5 g/dL — AB (ref 13.0–17.0)
MCH: 28.2 pg (ref 26.0–34.0)
MCHC: 31.8 g/dL (ref 30.0–36.0)
MCV: 88.5 fL (ref 78.0–100.0)
Platelets: 187 10*3/uL (ref 150–400)
RBC: 4.44 MIL/uL (ref 4.22–5.81)
RDW: 18.6 % — ABNORMAL HIGH (ref 11.5–15.5)
WBC: 7.1 10*3/uL (ref 4.0–10.5)

## 2014-11-03 LAB — BASIC METABOLIC PANEL
Anion gap: 15 (ref 5–15)
BUN: 41 mg/dL — ABNORMAL HIGH (ref 6–23)
CHLORIDE: 97 mmol/L (ref 96–112)
CO2: 23 mmol/L (ref 19–32)
Calcium: 8.6 mg/dL (ref 8.4–10.5)
Creatinine, Ser: 1.75 mg/dL — ABNORMAL HIGH (ref 0.50–1.35)
GFR, EST AFRICAN AMERICAN: 39 mL/min — AB (ref >90)
GFR, EST NON AFRICAN AMERICAN: 34 mL/min — AB (ref >90)
Glucose, Bld: 66 mg/dL — ABNORMAL LOW (ref 70–99)
POTASSIUM: 4.2 mmol/L (ref 3.5–5.1)
Sodium: 135 mmol/L (ref 135–145)

## 2014-11-03 LAB — GLUCOSE, CAPILLARY
GLUCOSE-CAPILLARY: 68 mg/dL — AB (ref 70–99)
GLUCOSE-CAPILLARY: 181 mg/dL — AB (ref 70–99)
GLUCOSE-CAPILLARY: 233 mg/dL — AB (ref 70–99)
Glucose-Capillary: 79 mg/dL (ref 70–99)
Glucose-Capillary: 162 mg/dL — ABNORMAL HIGH (ref 70–99)

## 2014-11-03 LAB — PROTIME-INR
INR: 3.79 — ABNORMAL HIGH (ref 0.00–1.49)
PROTHROMBIN TIME: 37.7 s — AB (ref 11.6–15.2)

## 2014-11-03 MED ORDER — CARVEDILOL 6.25 MG PO TABS
6.2500 mg | ORAL_TABLET | Freq: Two times a day (BID) | ORAL | Status: DC
  Administered 2014-11-04: 6.25 mg via ORAL
  Filled 2014-11-03 (×4): qty 1

## 2014-11-03 NOTE — Progress Notes (Signed)
[facility] PROGRESS NOTE  Omar Walker ZOX:[id number] DOB: 1927/09/19 DOA: 10/31/2014 PCP: Quintella Reichert, MD  Brief Summary  Omar Walker is a 81 y.o. Male with past medical history of hypertension, gout, coronary artery disease, post status of stent placement, post status of CABG, congestive heart failure, atrial fibrillation, history of pulmonary embolism, long-term Coumadin use, history of aortic valve replacement, who presents with cough x 3-5 days productive of pinkish and jellylike sputum. He denied fever, chills, shortness of breath, chest pain.  He also reported choking with food during the last week. He was okay with fluid. No family history of esophageal cancer, but his mother had esophageal stricture. He denied unilateral weakness, numbness or tingling sensations. Patient denied fever, chills, headaches.  In ED, patient was found to have INR 4.01, BNP 925, negative troponin, no leukocytosis, temperature normal. Chest x-ray showed small to moderate RIGHT pleural effusion with underlying consolidation.  He was taken off his spironolactone about a month ago and he gained 10-lbs and developed abdominal swelling so it was restarted a few days ago.    Assessment/Plan  Aspiration pneumonia. Currently patient does not have a sepsis, is not on oxygen and is well-appearing - Convert to oral clinda at discharge - Urine legionella negative and S. pneumococcal antigen negative - Albuterol Nebulizer prn for SOB - Blood culture x2 NGTD -  CT chest:  No mass or obvious airway obstruction  Dysphagia:  Okay with liquids, but solid foods get stuck in chest and get regurgitated shortly thereafter.  He "vomits" or regurgitates frequently. Differential diagnoses include esophageal stricture, and esophageal cancer. -  Appreciate speech assistance  -  MBS without significant aspiration, but then choked on soft carrot when he got back to his room  -  Dysphagia 3 with thin -  Esophagram:  Prelim no  stricture.  F/u official read -  Outpatient GI follow up for additional testing -  Dysphgia 3 with thin    Acute on chronic congestive heart failure:  COLD/WET.  2-D echo on 10/23/14 showed EF 15-20%.  BNP 925, 10-lb weight gain and abdominal distension.  Despite increased lasix, minimal uop and creatinine rising (suspect cardiorenal syndrome) -  Telemetry:  Mostly rate controlled a flutter -  lasix  BID -  -700 mL's, weighted proximally stable -  continue spironolactone -  Daily weights, strict I/O -  Low salt diet -  Appreciate cardiology assistance  P Afib, currently in SR -  CHADS2vasc 4, on A/C  Bioprosthetic AV  Pulmonary embolism and DVT, received Vitamin K  on 2/27 -  INR trending down - coumadin per pharmacy  Diabetes mellitus: Patient is on Levemir 15 units at home.  Hypoglycemic -  D/c standing insulin with meals -  Already held levemir yesterday -  Continue low dose SSI   Coronary artery disease: Post status of the stent placement and CABG, hx of cardiac arrest. no chest pain currently. -continue aspirin, Coreg  CKD stage III, creatinine trending down slightly -  Minimize nephrotoxins -  Renally dose medications  Elevated bilirubin likely from vascular congestion, no abdominal tenderness -  Repeat bilirubin in AM  Diet:  Dysphagia 3, fluid restricted Access:  PIV IVF:  off Proph:  supratherapeutic INR  Code Status:  DNR Family Communication:  Patient, daughter and wife Disposition Plan:  Likely home tomorrow.       Consultants:  GI, Dr. Bosie Clos  Cardiology  Procedures:  MBS  CT chest  Antibiotics:  Clindamycin 2/25 >>  HPI/Subjective:  Not eating much.  Walked in the halls some with PT.  No pain or SOB.    Objective: Filed Vitals:   11/02/14 1608 11/02/14 2118 11/03/14 0614 11/03/14 0813  BP:  123/60 103/55 105/66  Pulse: 58 78 91 91  Temp:  97.5 F (36.4 C) 97.9 F (36.6 C)   TempSrc:  Oral Oral   Resp:  18 18   Height:       Weight:   86.047 kg (189 lb 11.2 oz)   SpO2:  98% 93%     Intake/Output Summary (Last 24 hours) at 11/03/14 1253 Last data filed at 11/03/14 0618  Gross per 24 hour  Intake    150 ml  Output    850 ml  Net   -700 ml   Filed Weights   11/01/14 0100 11/02/14 0500 11/03/14 0614  Weight: 80.967 kg (178 lb 8 oz) 85.7 kg (188 lb 15 oz) 86.047 kg (189 lb 11.2 oz)    Exam:   General:  Thin male, NAD, HOH  HEENT:  NCAT, MMM  Cardiovascular:  RRR, nl s1, soft s2, 2/6 systolic murmur RSB,1+ pulses, warm extremities  Respiratory:  Diminished but better breath sounds in right anterior chest and right posterior chest, no rhonchi, rales.  Abdomen:   NABS, soft, moderately distended, nontender  MSK:   Normal tone and bulk, 1+ pitting edema bilateral LEE, cold extremities   Neuro:  Grossly intact  Data Reviewed: Basic Metabolic Panel:  Recent Labs Lab 10/31/14 2019 11/01/14 0550 11/02/14 0502 11/03/14 0721  NA 133* 132* 132* 135  K 4.2 3.8 4.5 4.2  CL 98 98 99 97  CO2 GLUCOSE 160* 111* 92 66*  BUN 26* 27* 36* 41*  CREATININE 1.42* 1.42* 1.97* 1.75*  CALCIUM 8.9 8.6 8.6 8.6   Liver Function Tests:  Recent Labs Lab 11/01/14 0550  AST 26  ALT 8  ALKPHOS 71  BILITOT 1.6*  PROT 6.0  ALBUMIN 3.2*   No results for input(s): LIPASE, AMYLASE in the last 168 hours. No results for input(s): AMMONIA in the last 168 hours. CBC:  Recent Labs Lab 10/31/14 2019 11/01/14 0550 11/02/14 0502 11/03/14 0721  WBC 10.5 11.6* 8.3 7.1  NEUTROABS 8.6* 9.2*  --   --   HGB 13.1 12.5* 12.5* 12.5*  HCT 41.5 39.0 39.4 39.3  MCV 91.4 87.8 89.5 88.5  PLT 208 210 233 187   Cardiac Enzymes: No results for input(s): CKTOTAL, CKMB, CKMBINDEX, TROPONINI in the last 168 hours. BNP (last 3 results)  Recent Labs  10/31/14 2020  BNP 925.8*    ProBNP (last 3 results)  Recent Labs  12/17/13 0358 12/18/13 0552 01/02/14 0958  PROBNP 2955.0* 3688.0* 368.0*     CBG:  Recent Labs Lab 11/02/14 1803 11/02/14 2114 11/03/14 0816 11/03/14 0903 11/03/14 1152  GLUCAP 78 146* 68* 79 181*    Recent Results (from the past 240 hour(s))  Culture, blood (routine x 2)     Status: None (Preliminary result)   Collection Time: 10/31/14  9:28 PM  Result Value Ref Range Status   Specimen Description BLOOD RIGHT ARM  Final   Special Requests BOTTLES DRAWN AEROBIC AND ANAEROBIC 10 CC  Final   Culture   Final           BLOOD CULTURE RECEIVED NO GROWTH TO DATE CULTURE WILL BE HELD FOR 5 DAYS BEFORE ISSUING A FINAL NEGATIVE REPORT Note: Culture results may be compromised  due to an excessive volume of blood received in culture bottles. Performed at [facility]    Report Status PENDING  Incomplete  Culture, blood (routine x 2)     Status: None (Preliminary result)   Collection Time: 10/31/14  9:40 PM  Result Value Ref Range Status   Specimen Description BLOOD LEFT ARM  Final   Special Requests BOTTLES DRAWN AEROBIC AND ANAEROBIC 10CC  Final   Culture   Final           BLOOD CULTURE RECEIVED NO GROWTH TO DATE CULTURE WILL BE HELD FOR 5 DAYS BEFORE ISSUING A FINAL NEGATIVE REPORT Note: Culture results may be compromised due to an excessive volume of blood received in culture bottles. Performed at [facility]    Report Status PENDING  Incomplete     Studies: Ct Chest Wo Contrast  11/01/2014   CLINICAL DATA:  Dyspnea.  Dysphagia.  Aspiration.  EXAM: CT CHEST WITHOUT CONTRAST  TECHNIQUE: Multidetector CT imaging of the chest was performed following the standard protocol without IV contrast.  COMPARISON:  Chest radiographs dated 10/31/2014  FINDINGS: Mediastinum/Nodes: Cardiomegaly.  No pericardial effusion.  Three-vessel coronary atherosclerosis. Postsurgical changes related to prior CABG. Atherosclerotic calcifications of the aortic arch.  No suspicious mediastinal or axillary lymphadenopathy.  Visualized thyroid is grossly unremarkable.   Lungs/Pleura: Moderate right pleural effusion.  Associated compressive atelectasis in the right lower lobe. Superimposed right lower lobe pneumonia/aspiration is not entirely excluded.  Additional multifocal ground-glass opacities in the central left lung (series 205/ images 28, 31, and 36), favored to reflect a mild interstitial edema, less likely multifocal pneumonia.  No suspicious pulmonary nodules.  No pneumothorax.  Upper abdomen: Visualized upper abdomen is notable for vascular calcifications, and nodular hepatic contour, and upper abdominal ascites.  Musculoskeletal: Degenerative changes of the lower cervical and visualized thoracolumbar spine.  IMPRESSION: Cardiomegaly with suspected mild interstitial edema and a moderate right pleural effusion.  Associated compressive atelectasis in the right lower lobe.  Superimposed right lower lobe pneumonia/aspiration is not entirely excluded.   Electronically Signed   By: Charline BillsSriyesh  Krishnan M.D.   On: 11/01/2014 22:30    Scheduled Meds: . allopurinol  150 mg Oral Daily  . antiseptic oral rinse  7 mL Mouth Rinse q12n4p  . aspirin EC  81 mg Oral Daily  . carvedilol  6.25 mg Oral BID WC  . chlorhexidine  15 mL Mouth Rinse BID  . clindamycin (CLEOCIN) IV  600 mg Intravenous 3 times per day  . digoxin  0.125 mg Oral Daily  . furosemide  80 mg Intravenous BID  . insulin aspart  0-9 Units Subcutaneous TID WC  . PARoxetine  10 mg Oral Daily  . saccharomyces boulardii  250 mg Oral BID  . spironolactone  25 mg Oral Daily   Continuous Infusions:    Principal Problem:   Aspiration pneumonia Active Problems:   CHF (congestive heart failure)   Acute on chronic systolic CHF (congestive heart failure)   PE (pulmonary embolism)   H/O aorto-femoral bypass   Coronary artery disease   Diabetes mellitus   Gout   Aortic valvular disease   Diabetes mellitus without complication   Essential hypertension   Difficulty swallowing   Chronic kidney disease, stage  III (moderate)    Time spent: 30 min    Omar Walker  [facility] Pager [phone]. If 7PM-7AM, please contact night-coverage at [email or web address], password [email or web address] 11/03/2014, 12:53 PM  LOS: 3 days

## 2014-11-03 NOTE — Progress Notes (Signed)
Hypoglycemic Event  CBG: 68  Treatment: 15 GM carbohydrate snack  Symptoms: None  Follow-up CBG: Time:0904 CBG Result:79  Possible Reasons for Event: Inadequate meal intake  Comments/MD notified:    Al DecantFlores, Andrik Sandt F  Remember to initiate Hypoglycemia Order Set & complete

## 2014-11-03 NOTE — Evaluation (Signed)
Physical Therapy Evaluation Patient Details Name: Omar EdenRobert G Meleski MRN: [id number] DOB: 06/22/1928 Today's Date: 11/03/2014   History of Present Illness  81 year old [place] secondary to severe aortic stenosis with severe left ventricular systolic dysfunction, acute on chronic systolic heart failure, left bundle branch block, paroxysmal atrial flutter, chronic kidney disease stage III with creatinines ranging from 1.1-2.2, mild hyponatremia 132.  Clinical Impression  Patient fairly steady during gait.  Feel patient safe for discharge home when medically ready with family.  I do recommend supervision during gait for next 2-3 days while patient regains his strength.  No further PT needs identified, will sign off.  Recommend patient ambulate with nursing and/or family while in hospital.    Follow Up Recommendations No PT follow up    Equipment Recommendations  None recommended by PT    Recommendations for Other Services       Precautions / Restrictions Precautions Precautions: None Restrictions Weight Bearing Restrictions: No      Mobility  Bed Mobility               General bed mobility comments: not tested, patient in chair  Transfers Overall transfer level: Modified independent Equipment used: Straight cane                Ambulation/Gait Ambulation/Gait assistance: Supervision Ambulation Distance (Feet): 100 Feet Assistive device: Straight cane Gait Pattern/deviations: WFL(Within Functional Limits)     General Gait Details: overall very steady, slight unsteadiness during turn.  Recommend supervision for next 2-3 days during ambulation  Stairs            Wheelchair Mobility    Modified Rankin (Stroke Patients Only)       Balance Overall balance assessment: Needs assistance                             High Level Balance Comments: slight unsteadiness during turns while  ambulating, able to self regain.             Pertinent Vitals/Pain Pain Assessment: No/denies pain    Home Living Family/patient expects to be discharged to:: Private residence Living Arrangements: Spouse/significant other;Children Available Help at Discharge: Family Type of Home: House Home Access: Stairs to enter Entrance Stairs-Rails: Right Entrance Stairs-Number of Steps: 5 Home Layout: One level Home Equipment: Cane - single point      Prior Function Level of Independence: Independent with assistive device(s)               Hand Dominance        Extremity/Trunk Assessment   Upper Extremity Assessment: Overall WFL for tasks assessed           Lower Extremity Assessment: Overall WFL for tasks assessed         Communication   Communication: No difficulties;HOH  Cognition Arousal/Alertness: Awake/alert Behavior During Therapy: WFL for tasks assessed/performed Overall Cognitive Status: Within Functional Limits for tasks assessed                      General Comments      Exercises        Assessment/Plan    PT Assessment Patent does not need any further PT services  PT Diagnosis Generalized weakness   PT Problem List    PT Treatment Interventions     PT Goals (Current goals can be found in the Care Plan  section) Acute Rehab PT Goals PT Goal Formulation: All assessment and education complete, DC therapy    Frequency     Barriers to discharge        Co-evaluation               End of Session Equipment Utilized During Treatment: Gait belt Activity Tolerance: Patient tolerated treatment well Patient left: in chair;with call bell/phone within reach;with family/visitor present           Time: 1001-1019 PT Time Calculation (min) (ACUTE ONLY): 18 min   Charges:   PT Evaluation $Initial PT Evaluation Tier I: 1 Procedure     PT G CodesOlivia Canter 11/03/2014, 10:22 AM  11/03/2014 Corlis Hove,  PT [phone]

## 2014-11-03 NOTE — Discharge Summary (Addendum)
Physician Discharge Summary  Omar Walker ZOX:[id number] DOB: [birth year] DOA: 10/31/2014  PCP: Quintella Reichert, MD  Admit date: 10/31/2014 Discharge date: 11/04/2014  Recommendations for Outpatient Follow-up:  1. F/u with Cardiology, Dr. Mayford Knife, in 1-2 weeks 2. Clindamycin for aspiration pneumonia 3. F/u with Gastroenterology, Dr. Bosie Clos, 1-2 weeks for dysphagia 4. Stopped insulin for now due to frequent hypoglycemia.  Goal A1c probably closer to 7.5 anyway given age and comorbidities and currently 7.  Advised to resume low dose levemir if CBG consistently in 200s.   5. INR check on Wed.  Warfarin 2.5mg  nightly starting 3/1.  Discharge Diagnoses:  Principal Problem:   Aspiration pneumonia Active Problems:   CHF (congestive heart failure)   Acute on chronic systolic CHF (congestive heart failure)   PE (pulmonary embolism)   H/O aorto-femoral bypass   Coronary artery disease   Diabetes mellitus   Gout   Aortic valvular disease   Diabetes mellitus without complication   Essential hypertension   Difficulty swallowing   Chronic kidney disease, stage III (moderate)   Dysphagia   Difficulty swallowing solids   Discharge Condition: stable, improved  Diet recommendation: dysphagia 3  Wt Readings from Last 3 Encounters:  11/04/14 84.5 kg (186 lb 4.6 oz)  10/18/14 80.967 kg (178 lb 8 oz)  01/02/14 87 kg (191 lb 12.8 oz)    History of present illness:  Omar Walker is a 81 y.o. Male with past medical history of hypertension, gout, coronary artery disease, post status of stent placement, post status of CABG, congestive heart failure, atrial fibrillation, history of pulmonary embolism, long-term Coumadin use, history of aortic valve replacement, who presents with cough x 3-5 days productive of pinkish and jellylike sputum. He denied fever, chills, shortness of breath, chest pain. He also reported choking with food during the last week. He was okay with fluid. No family history of  esophageal cancer, but his mother had esophageal stricture. He denied unilateral weakness, numbness or tingling sensations. Patient denied fever, chills, headaches. In ED, patient was found to have INR 4.01, BNP 925, negative troponin, no leukocytosis, temperature normal. Chest x-ray showed small to moderate RIGHT pleural effusion with underlying consolidation. He was taken off his spironolactone about a month ago and he gained 10-lbs and developed abdominal swelling so it was restarted a few prior to admission.  He was started on clindamycin for aspiration pneumonia.  Although he had problems with choking during hospitalization, he passed a MBS and esophagram demonstrated no strictures.  He was also seen by cardiology and was given IV lasix  BID for a few days with about 2L of diuresis and was advised to resume lasix  PO BID at home upon discharge.    Hospital Course:   Aspiration pneumonia.   Evaluated with MBS and esophagram and no evidence of dysphagia or obstruction with objective tests.  Never hypoxic or in respiratory distress.   -  Oral clindamycin to complete a 7-day course -  Florastor to minimize risk of C. Diff diarrhea.  Rx for 3 month course - Urine legionella negative and S. pneumococcal antigen negative - Blood culture x2 NGTD  - CT chest: No mass or obvious airway obstruction  Dysphagia: Okay with liquids, but solid foods get stuck in chest and get regurgitated shortly thereafter.  - MBS without significant aspiration, but then choked on soft carrot when he got back to his room  - Dysphagia 3 with thin - Esophagram: Prelim no stricture. F/u official read - Outpatient  GI follow up for additional testing  Acute on chronic congestive heart failure: COLD/WET. 2-D echo on 10/23/14 showed EF 15-20%. BNP 925, 10-lb weight gain and abdominal distension. about 2L of diuresis with lasix 80mg  IV BID.  - Telemetry: Rate controlled a flutter - lasix 40mg  BID until  outpatient follow up - continue spironolactone - Low salt diet - Appreciate cardiology assistance  P Afib, currently in SR - CHADS2vasc 4 -  Resume warfarin as below  Bioprosthetic AV  Pulmonary embolism and DVT, received Vitamin K 5mg  PO on 2/27 - INR trending down but still supratherapeutic -  Start warfarin 2.5mg  on 3/1 and have INR check on 3/2 for adjustments as needed  Diabetes mellitus: Patient is on Levemir 15 units at home with aspart with meals, but hypoglycemic during hospitalization -  A1c is 7 and goal is probably around 7.5 -  Asked to stop insulin for now and check blood sugars.  Resume levemir 5 units once CBG consistently > 200.    Coronary artery disease: Post status of the stent placement and CABG, hx of cardiac arrest. no chest pain currently. -continue aspirin, Coreg  CKD stage III, creatinine trended down to 1.67 from 1.97 witih diuresis  Elevated bilirubin likely from vascular congestion, no abdominal tenderness   Consultants:  GI, Dr. Bosie Clos  Cardiology  Procedures:  MBS  CT chest  Antibiotics:  Clindamycin 2/25 >>  Discharge Exam: Filed Vitals:   11/04/14 1353  BP: 125/54  Pulse: 58  Temp: 97.6 F (36.4 C)  Resp: 16   Filed Vitals:   11/03/14 2228 11/04/14 0546 11/04/14 1024 11/04/14 1353  BP: 107/66 123/69 119/81 125/54  Pulse:    58  Temp: 97.7 F (36.5 C) 98 F (36.7 C)  97.6 F (36.4 C)  TempSrc: Oral Oral  Oral  Resp: 17 22  16   Height:      Weight:  84.5 kg (186 lb 4.6 oz)    SpO2: 96% 92%  99%     General: Thin male, NAD, HOH  HEENT: NCAT, MMM  Cardiovascular: RRR, nl s1, soft s2, 2/6 systolic murmur RSB,1+ pulses, warm extremities  Respiratory: Diminished but better breath sounds in right anterior chest and right posterior chest, no rhonchi, rales.  Abdomen: NABS, soft, moderately distended, nontender  MSK: Normal tone and bulk, 1+ pitting edema bilateral LEE, warm extremities   Neuro:  Grossly intact  Discharge Instructions      Discharge Instructions    (HEART FAILURE PATIENTS) Call MD:  Anytime you have any of the following symptoms: 1) 3 pound weight gain in 24 hours or 5 pounds in 1 week 2) shortness of breath, with or without a dry hacking cough 3) swelling in the hands, feet or stomach 4) if you have to sleep on extra pillows at night in order to breathe.    Complete by:  As directed      Call MD for:  difficulty breathing, headache or visual disturbances    Complete by:  As directed      Call MD for:  extreme fatigue    Complete by:  As directed      Call MD for:  hives    Complete by:  As directed      Call MD for:  persistant dizziness or light-headedness    Complete by:  As directed      Call MD for:  persistant nausea and vomiting    Complete by:  As directed  Call MD for:  severe uncontrolled pain    Complete by:  As directed      Call MD for:  temperature >100.4    Complete by:  As directed      Diet general    Complete by:  As directed      Discharge instructions    Complete by:  As directed   You were hospitalized with aspiration pneumonia and heart failure exacerbation.  For your heart failure, please go back to taking carvedilol 6.25mg  twice a day and increase your lasix to 40mg  TWICE a day until you follow up with Dr. Mayford Knife.  Take some extra potassium while taking high dose lasix please.  Clindamycin is a potent antibiotic and can cause infectious diarrhea.  Please use florastor to reduce your risk of infectious diarrhea.  STOP you insulin for now and just check your blood sugars.  Once your blood sugars are consistently greater than 200 at home, start taking levemir 5 units once daily and call your prescribing doctor for more instructions.  Your INR was very high and is still high.  Do NOT take warfarin tonight.  Tomorrow (Tuesday) night, take 2.5mg  of warfarin and have your INR checked on Wednesday morning.     Increase activity slowly     Complete by:  As directed             Medication List    STOP taking these medications        insulin aspart 100 UNIT/ML injection  Commonly known as:  novoLOG     insulin detemir 100 UNIT/ML injection  Commonly known as:  LEVEMIR      TAKE these medications        allopurinol 300 MG tablet  Commonly known as:  ZYLOPRIM  Take 150 mg by mouth daily.     aspirin EC 81 MG tablet  Take 81 mg by mouth daily.     carvedilol 6.25 MG tablet  Commonly known as:  COREG  Take 1 tablet (6.25 mg total) by mouth 2 (two) times daily with a meal.     clindamycin 300 MG capsule  Commonly known as:  CLEOCIN  Take 1 capsule (300 mg total) by mouth 3 (three) times daily.     digoxin 0.125 MG tablet  Commonly known as:  LANOXIN  Take 0.125 mg by mouth daily.     furosemide 40 MG tablet  Commonly known as:  LASIX  Take 1 tablet (40 mg total) by mouth 2 (two) times daily.     PARoxetine 10 MG tablet  Commonly known as:  PAXIL  Take 10 mg by mouth daily.     Potassium Chloride ER 20 MEQ Tbcr  Take 20 mEq by mouth daily.     saccharomyces boulardii 250 MG capsule  Commonly known as:  FLORASTOR  Take 1 capsule (250 mg total) by mouth 2 (two) times daily.     spironolactone 25 MG tablet  Commonly known as:  ALDACTONE  Take 25 mg by mouth daily.     warfarin 5 MG tablet  Commonly known as:  COUMADIN  Take 0.5 tablets (2.5 mg total) by mouth daily at 6 PM.       Follow-up Information    Follow up with Quintella Reichert, MD In 2 weeks.   Specialty:  Cardiology   Contact information:   [address] [place] [address] [phone]       Follow up with SCHOOLER,VINCENT C.,  MD. Schedule an appointment as soon as possible for a visit in 1 month.   Specialty:  Gastroenterology   Contact information:   [address] [place] [id number] [phone]        The results of significant diagnostics from this hospitalization (including imaging,  microbiology, ancillary and laboratory) are listed below for reference.    Significant Diagnostic Studies: Dg Chest 2 View  10/31/2014   CLINICAL DATA:  Hemoptysis, hypertension, diabetes CHF.  Edema.  EXAM: CHEST  2 VIEW  COMPARISON:  Chest radiograph December 17, 2013  FINDINGS: The cardiac silhouette appears moderately enlarged, unchanged. Mediastinal silhouette is nonsuspicious, status post median sternotomy. Increasing small to moderate RIGHT pleural effusion with underlying consolidation. Mild chronic interstitial changes with strandy densities LEFT lung base. No pneumothorax.  Mild degenerative change of the thoracic spine. Soft tissue planes are nonsuspicious.  IMPRESSION: Small to moderate RIGHT pleural effusion with underlying consolidation. Recommend follow-up chest radiograph to verify improvement.  Stable cardiomegaly. Interstitial prominence suggest acute or chronic pulmonary edema. LEFT lung base atelectasis.   Electronically Signed   By: Awilda Metroourtnay  Bloomer   On: 10/31/2014 21:03   Ct Chest Wo Contrast  11/01/2014   CLINICAL DATA:  Dyspnea.  Dysphagia.  Aspiration.  EXAM: CT CHEST WITHOUT CONTRAST  TECHNIQUE: Multidetector CT imaging of the chest was performed following the standard protocol without IV contrast.  COMPARISON:  Chest radiographs dated 10/31/2014  FINDINGS: Mediastinum/Nodes: Cardiomegaly.  No pericardial effusion.  Three-vessel coronary atherosclerosis. Postsurgical changes related to prior CABG. Atherosclerotic calcifications of the aortic arch.  No suspicious mediastinal or axillary lymphadenopathy.  Visualized thyroid is grossly unremarkable.  Lungs/Pleura: Moderate right pleural effusion.  Associated compressive atelectasis in the right lower lobe. Superimposed right lower lobe pneumonia/aspiration is not entirely excluded.  Additional multifocal ground-glass opacities in the central left lung (series 205/ images 28, 31, and 36), favored to reflect a mild interstitial edema, less  likely multifocal pneumonia.  No suspicious pulmonary nodules.  No pneumothorax.  Upper abdomen: Visualized upper abdomen is notable for vascular calcifications, and nodular hepatic contour, and upper abdominal ascites.  Musculoskeletal: Degenerative changes of the lower cervical and visualized thoracolumbar spine.  IMPRESSION: Cardiomegaly with suspected mild interstitial edema and a moderate right pleural effusion.  Associated compressive atelectasis in the right lower lobe.  Superimposed right lower lobe pneumonia/aspiration is not entirely excluded.   Electronically Signed   By: Charline BillsSriyesh  Krishnan M.D.   On: 11/01/2014 22:30    Microbiology: Recent Results (from the past 240 hour(s))  Culture, blood (routine x 2)     Status: None (Preliminary result)   Collection Time: 10/31/14  9:28 PM  Result Value Ref Range Status   Specimen Description BLOOD RIGHT ARM  Final   Special Requests BOTTLES DRAWN AEROBIC AND ANAEROBIC 10 CC  Final   Culture   Final           BLOOD CULTURE RECEIVED NO GROWTH TO DATE CULTURE WILL BE HELD FOR 5 DAYS BEFORE ISSUING A FINAL NEGATIVE REPORT Note: Culture results may be compromised due to an excessive volume of blood received in culture bottles. Performed at [facility]    Report Status PENDING  Incomplete  Culture, blood (routine x 2)     Status: None (Preliminary result)   Collection Time: 10/31/14  9:40 PM  Result Value Ref Range Status   Specimen Description BLOOD LEFT ARM  Final   Special Requests BOTTLES DRAWN AEROBIC AND ANAEROBIC 10CC  Final   Culture   Final           BLOOD CULTURE RECEIVED NO GROWTH TO DATE CULTURE WILL BE HELD FOR 5 DAYS BEFORE ISSUING A FINAL NEGATIVE REPORT Note: Culture results may be compromised due to an excessive volume of blood received in culture bottles. Performed at [facility]    Report Status PENDING  Incomplete     Labs: Basic Metabolic Panel:  Recent Labs Lab 10/31/14 2019 11/01/14 0550  11/02/14 0502 11/03/14 0721 11/04/14 0546  NA 133* 132* 132* 135 133*  K 4.2 3.8 4.5 4.2 4.0  CL 98 98 99 97 96  CO2 GLUCOSE 160* 111* 92 66* 166*  BUN 26* 27* 36* 41* 39*  CREATININE 1.42* 1.42* 1.97* 1.75* 1.67*  CALCIUM 8.9 8.6 8.6 8.6 8.7   Liver Function Tests:  Recent Labs Lab 11/01/14 0550  AST 26  ALT 8  ALKPHOS 71  BILITOT 1.6*  PROT 6.0  ALBUMIN 3.2*   No results for input(s): LIPASE, AMYLASE in the last 168 hours. No results for input(s): AMMONIA in the last 168 hours. CBC:  Recent Labs Lab 10/31/14 2019 11/01/14 0550 11/02/14 0502 11/03/14 0721  WBC 10.5 11.6* 8.3 7.1  NEUTROABS 8.6* 9.2*  --   --   HGB 13.1 12.5* 12.5* 12.5*  HCT 41.5 39.0 39.4 39.3  MCV 91.4 87.8 89.5 88.5  PLT 208 210 233 187   Cardiac Enzymes: No results for input(s): CKTOTAL, CKMB, CKMBINDEX, TROPONINI in the last 168 hours. BNP: BNP (last 3 results)  Recent Labs  10/31/14 2020  BNP 925.8*    ProBNP (last 3 results)  Recent Labs  12/17/13 0358 12/18/13 0552 01/02/14 0958  PROBNP 2955.0* 3688.0* 368.0*    CBG:  Recent Labs Lab 11/03/14 1152 11/03/14 1646 11/03/14 2220 11/04/14 0850 11/04/14 1213  GLUCAP 181* 162* 233* 160* 193*    Time coordinating discharge: 35 minutes  Signed:  Amr Sturtevant  [facility] 11/04/2014, 2:08 PM

## 2014-11-03 NOTE — Progress Notes (Signed)
Subjective:  Feeling better today. Still with swallowing difficulty. Walked with PT did very well.  Objective:  Vital Signs in the last 24 hours: Temp:  [97.5 F (36.4 C)-97.9 F (36.6 C)] 97.9 F (36.6 C) (02/28 1610) Pulse Rate:  [58-91] 91 (02/28 0813) Resp:  [16-18] 18 (02/28 0614) BP: (101-123)/(47-66) 105/66 mmHg (02/28 0813) SpO2:  [93 %-98 %] 93 % (02/28 0614) Weight:  [189 lb 11.2 oz (86.047 kg)] 189 lb 11.2 oz (86.047 kg) (02/28 9604)  Intake/Output from previous day: 02/27 0701 - 02/28 0700 In: 150 [IV Piggyback:150] Out: 850 [Urine:850]   Physical Exam: General: Elderly, in no acute distress. Head:  Normocephalic and atraumatic. Lungs: Clear to auscultation and percussion. Heart: Normal S1 and S2.  1/6 SEM, no rubs or gallops.  Abdomen: soft, non-tender, positive bowel sounds. Protuberant Extremities: No clubbing or cyanosis. No edema. Neurologic: Alert and oriented x 3.    Lab Results:  Recent Labs  11/02/14 0502 11/03/14 0721  WBC 8.3 7.1  HGB 12.5* 12.5*  PLT 233 187    Recent Labs  11/02/14 0502 11/03/14 0721  NA 132* 135  K 4.5 4.2  CL 99 97  CO2 24 23  GLUCOSE 92 66*  BUN 36* 41*  CREATININE 1.97* 1.75*    Hepatic Function Panel  Recent Labs  11/01/14 0550  PROT 6.0  ALBUMIN 3.2*  AST 26  ALT 8  ALKPHOS 71  BILITOT 1.6*    Recent Labs  11/01/14 0550  CHOL 155     Imaging: Ct Chest Wo Contrast  11/01/2014   CLINICAL DATA:  Dyspnea.  Dysphagia.  Aspiration.  EXAM: CT CHEST WITHOUT CONTRAST  TECHNIQUE: Multidetector CT imaging of the chest was performed following the standard protocol without IV contrast.  COMPARISON:  Chest radiographs dated 10/31/2014  FINDINGS: Mediastinum/Nodes: Cardiomegaly.  No pericardial effusion.  Three-vessel coronary atherosclerosis. Postsurgical changes related to prior CABG. Atherosclerotic calcifications of the aortic arch.  No suspicious mediastinal or axillary lymphadenopathy.   Visualized thyroid is grossly unremarkable.  Lungs/Pleura: Moderate right pleural effusion.  Associated compressive atelectasis in the right lower lobe. Superimposed right lower lobe pneumonia/aspiration is not entirely excluded.  Additional multifocal ground-glass opacities in the central left lung (series 205/ images 28, 31, and 36), favored to reflect a mild interstitial edema, less likely multifocal pneumonia.  No suspicious pulmonary nodules.  No pneumothorax.  Upper abdomen: Visualized upper abdomen is notable for vascular calcifications, and nodular hepatic contour, and upper abdominal ascites.  Musculoskeletal: Degenerative changes of the lower cervical and visualized thoracolumbar spine.  IMPRESSION: Cardiomegaly with suspected mild interstitial edema and a moderate right pleural effusion.  Associated compressive atelectasis in the right lower lobe.  Superimposed right lower lobe pneumonia/aspiration is not entirely excluded.   Electronically Signed   By: Charline Bills M.D.   On: 11/01/2014 22:30   Personally viewed.   Telemetry: Aflutter HR 60. Personally viewed.    Cardiac Studies:  ECHO - EF 15%, AVR normal  Assessment/Plan:  Principal Problem:   Aspiration pneumonia Active Problems:   CHF (congestive heart failure)   Acute on chronic systolic CHF (congestive heart failure)   PE (pulmonary embolism)   H/O aorto-femoral bypass   Coronary artery disease   Diabetes mellitus   Gout   Aortic valvular disease   Diabetes mellitus without complication   Essential hypertension   Difficulty swallowing   Chronic kidney disease, stage III (moderate)  81 year old male with bypass surgery recently in [place]  with aortic valve replacement secondary to severe aortic stenosis with severe left ventricular systolic dysfunction, acute on chronic systolic heart failure, left bundle branch block, paroxysmal atrial flutter, chronic kidney disease stage III with creatinines ranging from 1.1-2.2,  mild hyponatremia 132.  1. Acute on chronic systolic heart failure  -ejection fraction is severely reduced at 15% despite aortic valve replacement and bypass surgery.   - BP is a bit soft so I will decrease coreg to 6.25 BID. Change to 12.5 BID was recent because of tachycardia associated with atrial flutter. Watch for any worsening HR. I would like to increase BP to allow for improved diuresis.   - Continue IV 80 BID lasix today  - Tomorrow change to PO perhaps 80 BID to continue with diuresis while home.  - He would really like to go home tomorrow and be in comfortable environment. This seems reasonable.   - Will need close follow up with Dr. Mayford Knifeurner in about 1 week.   2. Atrial flutter  - well rate controlled at rest  - Coumadin  3. DM 2  - meds reviewed. Mildly hypoglycemic this AM, 68.   - Per primary team.   4. Aortic valve replacement  - stable.   5. DNR - after long discussion with family, all are in agreement with DNR now.   Will follow.    Adam Sanjuan 11/03/2014, 11:21 AM

## 2014-11-04 DIAGNOSIS — R1310 Dysphagia, unspecified: Secondary | ICD-10-CM | POA: Insufficient documentation

## 2014-11-04 LAB — BASIC METABOLIC PANEL
ANION GAP: 11 (ref 5–15)
BUN: 39 mg/dL — ABNORMAL HIGH (ref 6–23)
CHLORIDE: 96 mmol/L (ref 96–112)
CO2: 26 mmol/L (ref 19–32)
Calcium: 8.7 mg/dL (ref 8.4–10.5)
Creatinine, Ser: 1.67 mg/dL — ABNORMAL HIGH (ref 0.50–1.35)
GFR calc Af Amer: 42 mL/min — ABNORMAL LOW (ref >90)
GFR calc non Af Amer: 36 mL/min — ABNORMAL LOW (ref >90)
Glucose, Bld: 166 mg/dL — ABNORMAL HIGH (ref 70–99)
POTASSIUM: 4 mmol/L (ref 3.5–5.1)
Sodium: 133 mmol/L — ABNORMAL LOW (ref 135–145)

## 2014-11-04 LAB — PROTIME-INR
INR: 3.18 — ABNORMAL HIGH (ref 0.00–1.49)
PROTHROMBIN TIME: 32.8 s — AB (ref 11.6–15.2)

## 2014-11-04 LAB — GLUCOSE, CAPILLARY
GLUCOSE-CAPILLARY: 160 mg/dL — AB (ref 70–99)
GLUCOSE-CAPILLARY: 193 mg/dL — AB (ref 70–99)

## 2014-11-04 MED ORDER — FUROSEMIDE 40 MG PO TABS: 80.0000 mg | ORAL_TABLET | Freq: Two times a day (BID) | ORAL | Status: DC

## 2014-11-04 MED ORDER — WARFARIN SODIUM 5 MG PO TABS: 2.5000 mg | ORAL_TABLET | Freq: Every day | ORAL | Status: AC

## 2014-11-04 MED ORDER — CLINDAMYCIN HCL 300 MG PO CAPS: 300.0000 mg | ORAL_CAPSULE | Freq: Three times a day (TID) | ORAL | Status: DC

## 2014-11-04 MED ORDER — CARVEDILOL 6.25 MG PO TABS: 6.2500 mg | ORAL_TABLET | Freq: Two times a day (BID) | ORAL | Status: DC

## 2014-11-04 MED ORDER — SACCHAROMYCES BOULARDII 250 MG PO CAPS: 250.0000 mg | ORAL_CAPSULE | Freq: Two times a day (BID) | ORAL | Status: DC

## 2014-11-04 MED ORDER — POTASSIUM CHLORIDE ER 20 MEQ PO TBCR: 20.0000 meq | EXTENDED_RELEASE_TABLET | Freq: Every day | ORAL | Status: AC

## 2014-11-04 MED ORDER — FUROSEMIDE 40 MG PO TABS
40.0000 mg | ORAL_TABLET | Freq: Two times a day (BID) | ORAL | Status: DC
  Filled 2014-11-04 (×2): qty 1

## 2014-11-04 MED ORDER — FUROSEMIDE 40 MG PO TABS: 40.0000 mg | ORAL_TABLET | Freq: Two times a day (BID) | ORAL | Status: DC

## 2014-11-04 NOTE — Progress Notes (Addendum)
    Subjective:  Denies dyspnea or chest pain  Objective:  Vital Signs in the last 24 hours: Temp:  [97.3 F (36.3 C)-98 F (36.7 C)] 98 F (36.7 C) (02/29 0546) Pulse Rate:  [59-64] 64 (02/28 1643) Resp:  [17-22] 22 (02/29 0546) BP: (103-123)/(60-81) 119/81 mmHg (02/29 1024) SpO2:  [92 %-98 %] 92 % (02/29 0546) Weight:  [186 lb 4.6 oz (84.5 kg)] 186 lb 4.6 oz (84.5 kg) (02/29 0546)  Intake/Output from previous day: 02/28 0701 - 02/29 0700 In: 150 [IV Piggyback:150] Out: 1250 [Urine:1250]   Physical Exam: General: Elderly, in no acute distress. Head:  Normocephalic and atraumatic. Lungs: Clear to auscultation and percussion. Heart: Normal S1 and S2.  1/6 SEM, no rubs or gallops.  Abdomen: soft, non-tender, positive bowel sounds. Protuberant Extremities: Trace edema. Neurologic: Alert and oriented x 3.    Lab Results:  Recent Labs  11/02/14 0502 11/03/14 0721  WBC 8.3 7.1  HGB 12.5* 12.5*  PLT 233 187    Recent Labs  11/03/14 0721 11/04/14 0546  NA 135 133*  K 4.2 4.0  CL 97 96  CO2 23 26  GLUCOSE 66* 166*  BUN 41* 39*  CREATININE 1.75* 1.67*     Cardiac Studies:  ECHO - EF 15%, AVR normal  Assessment/Plan:  Principal Problem:   Aspiration pneumonia Active Problems:   CHF (congestive heart failure)   Acute on chronic systolic CHF (congestive heart failure)   PE (pulmonary embolism)   H/O aorto-femoral bypass   Coronary artery disease   Diabetes mellitus   Gout   Aortic valvular disease   Diabetes mellitus without complication   Essential hypertension   Difficulty swallowing   Chronic kidney disease, stage III (moderate)   Dysphagia   Difficulty swallowing solids  81 year old [place] valve replacement secondary to severe aortic stenosis with severe left ventricular systolic dysfunction, acute on chronic systolic heart failure, left bundle branch block, paroxysmal atrial flutter, chronic  kidney disease stage III with creatinines ranging from 1.1-2.2, mild hyponatremia 132.  1. Acute on chronic systolic heart failure  -ejection fraction is severely reduced at 15% despite aortic valve replacement and bypass surgery.   -continue coreg; can consider hydralazine/nitrates or ACE I as oupt if BP and renal function allow. Change lasix to 40 mg po BID; check bmet 11/06/12 with results to Dr Mayford Knifeurner.  2. Atrial flutter  - well rate controlled at rest  - Coumadin with goal INR 2-3; resume coumadin when INR improves  3. DM 2  - Per primary team.   4. Aortic valve replacement  - stable.   5. DNR  Patient can be DCed from a cardiac standpoint and FU with Dr Mayford Knifeurner next week as scheduled; Will need close fu of INR and coumadin following DC (check INR Thurs with adjustments as needed).   Olga MillersBrian Crenshaw 11/04/2014, 12:09 PM

## 2014-11-04 NOTE — Progress Notes (Signed)
Omar Walker discharged Home with family per MD order.  Discharge instructions reviewed and discussed with the patient, all questions and concerns answered. Copy of instructions and care notes given to patient.    Medication List    STOP taking these medications        insulin aspart 100 UNIT/ML injection  Commonly known as:  novoLOG     insulin detemir 100 UNIT/ML injection  Commonly known as:  LEVEMIR      TAKE these medications        allopurinol 300 MG tablet  Commonly known as:  ZYLOPRIM  Take 150 mg by mouth daily.     aspirin EC 81 MG tablet  Take 81 mg by mouth daily.     carvedilol 6.25 MG tablet  Commonly known as:  COREG  Take 1 tablet (6.25 mg total) by mouth 2 (two) times daily with a meal.     clindamycin 300 MG capsule  Commonly known as:  CLEOCIN  Take 1 capsule (300 mg total) by mouth 3 (three) times daily.     digoxin 0.125 MG tablet  Commonly known as:  LANOXIN  Take 0.125 mg by mouth daily.     furosemide 40 MG tablet  Commonly known as:  LASIX  Take 1 tablet (40 mg total) by mouth 2 (two) times daily.     PARoxetine 10 MG tablet  Commonly known as:  PAXIL  Take 10 mg by mouth daily.     Potassium Chloride ER 20 MEQ Tbcr  Take 20 mEq by mouth daily.     saccharomyces boulardii 250 MG capsule  Commonly known as:  FLORASTOR  Take 1 capsule (250 mg total) by mouth 2 (two) times daily.     spironolactone 25 MG tablet  Commonly known as:  ALDACTONE  Take 25 mg by mouth daily.     warfarin 5 MG tablet  Commonly known as:  COUMADIN  Take 0.5 tablets (2.5 mg total) by mouth daily at 6 PM.        Patients skin is clean, dry and intact, no evidence of skin break down. IV site discontinued and catheter remains intact. Site without signs and symptoms of complications. Dressing and pressure applied.  Patient escorted to car by NT in a wheelchair,  no distress noted upon discharge.  Omar Walker, Omar Walker 11/04/2014 2:49 PM2

## 2014-11-04 NOTE — Evaluation (Addendum)
Occupational Therapy Evaluation Patient Details Name: Omar Walker MRN: [id number] DOB: 02-Nov-1927 Today's Date: 11/04/2014    History of Present Illness 81 year old male with bypass surgery recently in [place] with aortic valve replacement secondary to severe aortic stenosis with severe left ventricular systolic dysfunction, acute on chronic systolic heart failure, left bundle branch block, paroxysmal atrial flutter, chronic kidney disease stage III with creatinines ranging from 1.1-2.2, mild hyponatremia 132.   Clinical Impression   Pt admitted with above. Education provided in session and feel pt is safe to d/c home, from OT standpoint.    Follow Up Recommendations  No OT follow up;Supervision - Intermittent    Equipment Recommendations  None recommended by OT    Recommendations for Other Services       Precautions / Restrictions Precautions Precautions: None Restrictions Weight Bearing Restrictions: No      Mobility Bed Mobility               General bed mobility comments: not assessed  Transfers Overall transfer level: Modified independent Equipment used: Straight cane                  Balance  Supervision for ambulation with straight cane.                                          ADL Overall ADL's : Needs assistance/impaired             Lower Body Bathing: Supervison/ safety (standing)       Lower Body Dressing: Supervision/safety;Sit to/from stand   Toilet Transfer: Supervision/safety;Ambulation (cane; chair)           Functional mobility during ADLs: Supervision/safety;Cane General ADL Comments: Educated on safety such as safe shoewear, rugs/items on floor, and recommended someone continue to be with him for tub transfer. Recommended sitting for most of LB ADLs to be safe. Discussed options for shower chair-daughter states they will work out something for shower chair.     Vision  Pt wears glasses.    Perception     Praxis      Pertinent Vitals/Pain Pain Assessment: No/denies pain     Hand Dominance     Extremity/Trunk Assessment Upper Extremity Assessment Upper Extremity Assessment: Overall WFL for tasks assessed;Generalized weakness   Lower Extremity Assessment Lower Extremity Assessment: Defer to PT evaluation       Communication Communication Communication: HOH-has hearing aids   Cognition Arousal/Alertness: Awake/alert Behavior During Therapy: WFL for tasks assessed/performed Overall Cognitive Status: Within Functional Limits for tasks assessed                     General Comments       Exercises       Shoulder Instructions      Home Living Family/patient expects to be discharged to:: Private residence Living Arrangements: Spouse/significant other;Children Available Help at Discharge: Family Type of Home: House Home Access: Stairs to enter Secretary/administrator of Steps: 5 Entrance Stairs-Rails: Right Home Layout: One level     Bathroom Shower/Tub: Chief Strategy Officer: Standard (sink close)     Home Equipment: Cane - single point          Prior Functioning/Environment Level of Independence: Independent with assistive device(s)        Comments: spouse supervises pt for tub transfer and while he is bathing, she is  near.    OT Diagnosis: Generalized weakness   OT Problem List:     OT Treatment/Interventions:      OT Goals(Current goals can be found in the care plan section)    OT Frequency:     Barriers to D/C:            Co-evaluation              End of Session Equipment Utilized During Treatment: Gait belt;Other (comment) (cane)  Activity Tolerance: Patient tolerated treatment well Patient left: in chair;with call bell/phone within reach;with family/visitor present   Time: 1217-1230 OT Time Calculation (min): 13 min Charges:  OT General Charges $OT Visit: 1 Procedure OT Evaluation $Initial  OT Evaluation Tier I: 1 Procedure G-CodesEarlie Raveling:    Lylian Sanagustin L OTR/L [phone] 11/04/2014, 12:39 PM

## 2014-11-05 NOTE — Care Management Note (Signed)
    Page 1 of 1   11/05/2014     12:25:52 PM CARE MANAGEMENT NOTE 11/05/2014  Patient:  Omar Walker,Omar Walker   Account Number:  [id number]  Date Initiated:  11/05/2014  Documentation initiated by:  GRAVES-BIGELOW,Areg Bialas  Subjective/Objective Assessment:   Pt admitted for PNA. Pt is from home.     Action/Plan:   Pt d/c home no needs identified by CM.   Anticipated DC Date:  11/04/2014   Anticipated DC Plan:  HOME/SELF CARE      DC Planning Services  CM consult      Choice offered to / List presented to:             Status of service:  Completed, signed off Medicare Important Message given?  YES (If response is "NO", the following Medicare IM given date fields will be blank) Date Medicare IM given:  11/05/2014 Medicare IM given by:  Letha CapeAYLOR,DEBORAH Date Additional Medicare IM given:   Additional Medicare IM given by:    Discharge Disposition:  HOME/SELF CARE  Per UR Regulation:  Reviewed for med. necessity/level of care/duration of stay  If discussed at Long Length of Stay Meetings, dates discussed:    Comments:

## 2014-11-07 ENCOUNTER — Telehealth: Payer: Self-pay | Admitting: Cardiology

## 2014-11-07 ENCOUNTER — Other Ambulatory Visit (INDEPENDENT_AMBULATORY_CARE_PROVIDER_SITE_OTHER): Payer: Medicare Other | Admitting: *Deleted

## 2014-11-07 DIAGNOSIS — I10 Essential (primary) hypertension: Secondary | ICD-10-CM

## 2014-11-07 DIAGNOSIS — I5023 Acute on chronic systolic (congestive) heart failure: Secondary | ICD-10-CM

## 2014-11-07 LAB — CULTURE, BLOOD (ROUTINE X 2)
CULTURE: NO GROWTH
CULTURE: NO GROWTH

## 2014-11-07 NOTE — Telephone Encounter (Signed)
Pts daughter will be bringing him in today to check a bmet.  Pts daughter reports the pts INR was taken today and was WNL.  Pts daughter states the his BP has been running normal at 110/70 and HR- 92.

## 2014-11-07 NOTE — Telephone Encounter (Signed)
Pts daughter calling in concerns of the pt losing a total of 13 lbs in the course of the pt being admitted to the hospital on 2/25 until discharge on 2/29.   Daughter is concerned that the diuretic the pt is taking has caused him to lose too much weight.  Daughter also reports that the pt has diarrhea since being discharged on 2/29, which is concerning her as well.  Daughter is worried with the diuresis, diarrhea, and weight loss, the pt will become dehydrated.  Daughter states the pt was started on antibiotics in the hospital for aspiration pneumonia.  Daughter requesting the diuretics be held due to concerning weight loss.  Daughter states the pt is being treated with a probiotic florastor to prevent c-diff.  Daughter states the pt is drinking appropriately, but intake of food has decreased.   Daughter reports the pt does not have sob, cp, doe has improved, palpitations, dizziness, pre-synocpal, or syncopal issues at this time.  Daughter reports the pt is alert and oriented.  Daughter simply concerned with the weight loss and diarrhea.  Advised the daughter to keep the pt on his diuretics, for this is performing its duties by properly diuresing the pt from his acute CHF exacerbation.  Advised the daughter to have the pt continue his probiotic as prescribed.  Advised the daughter to contact the pts GI doctor,  Dr Bosie ClosSchooler now to schedule an appt in regards to possible c-diff, and testing. Advised the daughter on s/s of dehydration and when to be concerned.  Advised the daughter on placing the pt on the BRAT diet for GI upset.  Advised the daughter to make sure the pt is increasing his fluid intake.  Daughter requesting the pts appt with Dr Mayford Knifeurner be moved to a sooner date then 3/11. Informed the daughter that Dr Mayford Knifeurner and nurse are both out of the office today.  Informed the daughter that I will route this message to the both of them for further review, recommendation, and follow-up  thereafter. Daughter verbalized understanding and agrees with this plan.  Daughter states she will call the pts GI now to arrange an appt.

## 2014-11-07 NOTE — Addendum Note (Signed)
Addended by: Murtaza Shell K on: 11/07/2014 04:19 PM   Modules accepted: Orders  

## 2014-11-07 NOTE — Telephone Encounter (Signed)
New message     Pt released from hosp on mon with 2 presc for "water pills".  He has lost 13lbs in two days---peeing and has diarrhea.  Daughter is stopping water pill until she talks to us.  Please call

## 2014-11-07 NOTE — Addendum Note (Signed)
Addended by: Tonita PhoenixBOWDEN, ROBIN K on: 11/07/2014 04:19 PM   Modules accepted: Orders

## 2014-11-07 NOTE — Telephone Encounter (Signed)
I would like the patient to see his PCP today or tomorrow to address these concerns in the immediate period until he can get in with GI. Have him come in for BMET today and then will decide on lasix.  What has his BP been running

## 2014-11-08 LAB — BASIC METABOLIC PANEL
BUN: 24 mg/dL — ABNORMAL HIGH (ref 6–23)
CO2: 37 meq/L — AB (ref 19–32)
CREATININE: 1.61 mg/dL — AB (ref 0.40–1.50)
Calcium: 9.8 mg/dL (ref 8.4–10.5)
Chloride: 88 mEq/L — ABNORMAL LOW (ref 96–112)
GFR: 43.57 mL/min — ABNORMAL LOW (ref >60.00)
Glucose, Bld: 157 mg/dL — ABNORMAL HIGH (ref 70–99)
Potassium: 4.3 mEq/L (ref 3.5–5.1)
Sodium: 132 mEq/L — ABNORMAL LOW (ref 135–145)

## 2014-11-08 MED ORDER — FUROSEMIDE 40 MG PO TABS: 40.0000 mg | ORAL_TABLET | Freq: Every day | ORAL | Status: DC

## 2014-11-08 NOTE — Telephone Encounter (Signed)
Instructed patient's daughter to hold lasix for three days and then start lasix 40 mg daily. Repeat BMET to be done next Friday at OV with Dr. Mayford Knifeurner.

## 2014-11-08 NOTE — Telephone Encounter (Signed)
BMET reviewed with Dr. Excell Seltzerooper (DOD). Per Dr. Excell Seltzerooper, patient is to hold Lasix for 3 days and resume 40 mg daily on Monday.  Patient to have repeat BMET in one week.   Left message to call back.

## 2014-11-12 ENCOUNTER — Telehealth: Payer: Self-pay

## 2014-11-12 NOTE — Telephone Encounter (Signed)
I would like him to see his PCP to make sure nothing else is going on

## 2014-11-12 NOTE — Telephone Encounter (Signed)
Late Entry.  Patient's daughter calling to let Dr. Mayford Knifeurner know she thinks Coreg is making her father "see things." She says about once a day for a short period, he will mistake familiar faces for someone else or speak to someone who isn't there.  Patient has OV with Dr. Mayford Knifeurner on Friday.  Instructed patient's daughter that if Dr. Mayford Knifeurner has instructions before then, I will call her back.

## 2014-11-15 ENCOUNTER — Encounter: Payer: Self-pay | Admitting: Cardiology

## 2014-11-15 ENCOUNTER — Ambulatory Visit (INDEPENDENT_AMBULATORY_CARE_PROVIDER_SITE_OTHER): Payer: Medicare Other | Admitting: Cardiology

## 2014-11-15 VITALS — BP 100/52 | HR 52 | Ht 70.5 in | Wt 166.0 lb

## 2014-11-15 DIAGNOSIS — I5042 Chronic combined systolic (congestive) and diastolic (congestive) heart failure: Secondary | ICD-10-CM

## 2014-11-15 DIAGNOSIS — I259 Chronic ischemic heart disease, unspecified: Secondary | ICD-10-CM

## 2014-11-15 DIAGNOSIS — I10 Essential (primary) hypertension: Secondary | ICD-10-CM | POA: Diagnosis not present

## 2014-11-15 DIAGNOSIS — I2510 Atherosclerotic heart disease of native coronary artery without angina pectoris: Secondary | ICD-10-CM

## 2014-11-15 DIAGNOSIS — I420 Dilated cardiomyopathy: Secondary | ICD-10-CM

## 2014-11-15 DIAGNOSIS — I255 Ischemic cardiomyopathy: Secondary | ICD-10-CM

## 2014-11-15 DIAGNOSIS — I359 Nonrheumatic aortic valve disorder, unspecified: Secondary | ICD-10-CM | POA: Diagnosis not present

## 2014-11-15 DIAGNOSIS — I2583 Coronary atherosclerosis due to lipid rich plaque: Secondary | ICD-10-CM

## 2014-11-15 HISTORY — DX: Dilated cardiomyopathy: I42.0

## 2014-11-15 HISTORY — DX: Ischemic cardiomyopathy: I25.5

## 2014-11-15 NOTE — Progress Notes (Addendum)
Cardiology Office Note   Date:  11/15/2014   ID:  Omar Walker, DOB Jan 05, 1928, MRN [id number]  PCP:  Quintella Reichert, MD  Cardiologist:   Quintella Reichert, MD   Chief Complaint  Patient presents with  . Congestive Heart Failure  . Coronary Artery Disease  . Aortic Stenosis  . Atrial Fibrillation      History of Present Illness:Omar Walker is a 81 y.o. male who presents today for a follow up visit. He has CAD with prior MI with PCI in 2001, repeat NSTEMI in 2013 but no cath/PCI due to multiple comorbidities - sounded like this was when he had PEs. Other issues include past PCI, DM, HTN, aortic valve disease, low EF, AF, PE, PVD with prior history of aortofemoral bypass and prior cardiac arrest. He is on chronic anticoagulation with coumadin. Patient apparently lives in [place] and has cardiologist there. Comes here to [facility] for the winters. Noted in the past that he had apparently has refused any type of surgery for his situation. Past EF was around 30%.  Presented to [facility] back in April of 2015 while here visiting - had shortness of breath - EF was down to 15 to 20% - he was diuresed. Did have elevated troponin but felt to be due to his HF exacerbation. He subsequently went back to [place] and had CABG x 5 with AVR done in [place] - this was done back in October.  Readmitted in December with what sounds like atrial fib - not clear as to what was done. He is doing ok. Sleeps during the day. Not very active. Now using a cane. He denies any chest pain or SOB.  Weight is down. No palpitations, dizziness or syncope.     Past Medical History  Diagnosis Date  . Coronary artery disease   . Stented coronary artery   . Hypertension   . Aortic valve insufficiency   . CHF (congestive heart failure)   . AF (atrial fibrillation)   . Embolism and thrombosis   . PE (pulmonary embolism)     hx  . H/O cardiac arrest     "3-4 times at least over various years" (12/17/2013)  . H/O aorto-femoral bypass  12/17/2013  . Gout     "a few times"  . Decreased cardiac ejection fraction 12/17/2013  . Aortic valvular disease 12/17/2013  . Elevated troponin level 12/17/2013  . High cholesterol   . DVT, lower extremity   . Type II diabetes mellitus   . History of blood transfusion     "think related to aorto operation"  . GERD (gastroesophageal reflux disease)   . H/O hiatal hernia   . NSTEMI (non-ST elevated myocardial infarction) 2001; 2013    hx/notes 12/17/2013  . Ischemic dilated cardiomyopathy 11/15/2014    Past Surgical History  Procedure Laterality Date  . Appendectomy    . Aorta - bilateral femoral artery bypass graft  1990's    "feet had turned black"  . Glaucoma surgery Right     "took cataract off; drilled hole in eyeball to relieve pressure"  . Tibia fracture surgery Right     "just below the knee"  . Cardiac catheterization    . Coronary angioplasty with stent placement  ?2001    "I have a couple stents in my heart"     Current Outpatient Prescriptions  Medication Sig Dispense Refill  . allopurinol (ZYLOPRIM) 300 MG tablet Take 150 mg by mouth daily.    Marland Kitchen  aspirin EC 81 MG tablet Take 81 mg by mouth daily.    . BD PEN NEEDLE NANO U/F 32G X 4 MM MISC 4 (four) times daily. use as directed  0  . carvedilol (COREG) 6.25 MG tablet Take 1 tablet (6.25 mg total) by mouth 2 (two) times daily with a meal. 60 tablet 0  . digoxin (LANOXIN) 0.125 MG tablet Take 0.125 mg by mouth daily.    . furosemide (LASIX) 40 MG tablet Take 1 tablet (40 mg total) by mouth daily. 60 tablet 0  . Lancets (ONETOUCH ULTRASOFT) lancets   3  . LEVEMIR FLEXTOUCH 100 UNIT/ML Pen Inject 100 Units as directed daily. Inject 15 units into the skin in the am  0  . ONE TOUCH ULTRA TEST test strip   5  . PARoxetine (PAXIL) 10 MG tablet Take 10 mg by mouth daily.    . potassium chloride 20 MEQ TBCR Take 20 mEq by mouth daily. 30 tablet 0  . saccharomyces boulardii (FLORASTOR) 250 MG capsule Take 1 capsule (250 mg  total) by mouth 2 (two) times daily. 180 capsule 0  . spironolactone (ALDACTONE) 25 MG tablet Take 25 mg by mouth daily.    Marland Kitchen. warfarin (COUMADIN) 5 MG tablet Take 0.5 tablets (2.5 mg total) by mouth daily at 6 PM. 30 tablet 0   No current facility-administered medications for this visit.    Allergies:   Review of patient's allergies indicates no active allergies.    Social History:  The patient  reports that he has never smoked. He has never used smokeless tobacco. He reports that he drinks alcohol. He reports that he does not use illicit drugs.   Family History:  The patient's family history is not on file.    ROS:  Please see the history of present illness.   Otherwise, review of systems are positive for none.   All other systems are reviewed and negative.    PHYSICAL EXAM: VS:  BP 100/52 mmHg  Pulse 52  Ht 5' 10.5" (1.791 m)  Wt 166 lb (75.297 kg)  BMI 23.47 kg/m2  SpO2 99% , BMI Body mass index is 23.47 kg/(m^2). GEN: Well nourished, well developed, in no acute distress HEENT: normal Neck: no JVD, carotid bruits, or masses Cardiac: RRR; no murmurs, rubs, or gallops,no edema  Respiratory:  clear to auscultation bilaterally, normal work of breathing GI: soft, nontender, nondistended, + BS MS: no deformity or atrophy Skin: warm and dry, no rash Neuro:  Strength and sensation are intact Psych: euthymic mood, full affect   EKG:  EKG is not ordered today.    Recent Labs: 12/17/2013: TSH 2.780 01/02/2014: Pro B Natriuretic peptide (BNP) 368.0* 10/31/2014: B Natriuretic Peptide 925.8* 11/01/2014: ALT 8 11/03/2014: Hemoglobin 12.5*; Platelets 187 11/07/2014: BUN 24*; Creatinine 1.61*; Potassium 4.3; Sodium 132*    Lipid Panel    Component Value Date/Time   CHOL 155 11/01/2014 0550   TRIG 72 11/01/2014 0550   HDL 27* 11/01/2014 0550   CHOLHDL 5.7 11/01/2014 0550   VLDL 14 11/01/2014 0550   LDLCALC 114* 11/01/2014 0550      Wt Readings from Last 3 Encounters:    11/15/14 166 lb (75.297 kg)  11/04/14 186 lb 4.6 oz (84.5 kg)  10/18/14 178 lb 8 oz (80.967 kg)      Assessment / Plan:  1. Chronic systolic HF - recent echo with severe LV dysfunction EF 15-20%  2. CAD - prior MIs and prior PCI - now s/p  CABG x 5 - looks to be doing well.  3. CKD - repeat lab today  4. Severe AS - had refused prior work up in the past - tried to arrange TAVR last April but family then cancelled all follow up and the patient went back to [place] - now s/p AVR along with his CABG - done in [place] - need to get records - doing well.  5. Atrial fib - rate controlled after Coreg increased-I would favor management with rate control and anticoagulation. He is not symptomatic. Family does not wish to pursue cardioversion.   6.  Ischemic DCM EF 15-20%.  He appears euvolemic on exam.  Would continue on Coreg/aldactone/Lasix/digoxin.  Will hold on ACE I secondary to soft BP.  His LVF has not improved since his CABG and therefore is at risk of SCD from ventricular arrhythmias from his ischemic DCM.  I have recommended considering AICD implant but they want to wait and discuss this with his Cardiologist in [place].    Current medicines are reviewed at length with the patient today.  The patient does not have concerns regarding medicines.  The following changes have been made:  no change  Labs/ tests ordered today include: None  No orders of the defined types were placed in this encounter.     Disposition:   FU with me in 6 months   Signed, Quintella Reichert, MD  11/15/2014 4:35 PM    [facility] [address] [place], [place], [place]  [address] Phone: [phone]; Fax: [phone]

## 2014-11-15 NOTE — Patient Instructions (Signed)
Your physician recommends that you continue on your current medications as directed. Please refer to the Current Medication list given to you today.   Your physician wants you to follow-up in: 6 months with Dr. Turner. You will receive a reminder letter in the mail two months in advance. If you don't receive a letter, please call our office to schedule the follow-up appointment @ [phone].  

## 2014-11-15 NOTE — Telephone Encounter (Signed)
Addressed in OV today.

## 2015-02-14 ENCOUNTER — Encounter: Payer: Self-pay | Admitting: Cardiology

## 2015-09-16 IMAGING — RF DG ESOPHAGUS
7 series · 15 of 15 positions shown · non-contrast
Comparison: CT 11/01/2014

CLINICAL DATA: Dysphagia

EXAM:
ESOPHOGRAM/BARIUM SWALLOW
TECHNIQUE: Single contrast examination was performed using  thin barium.
FLUOROSCOPY TIME:  1 minutes 24 seconds

[Series 1: cp_standard · 0.25mm/px · 3 of 3 slices shown (1 of 5)]
[im 1/3]
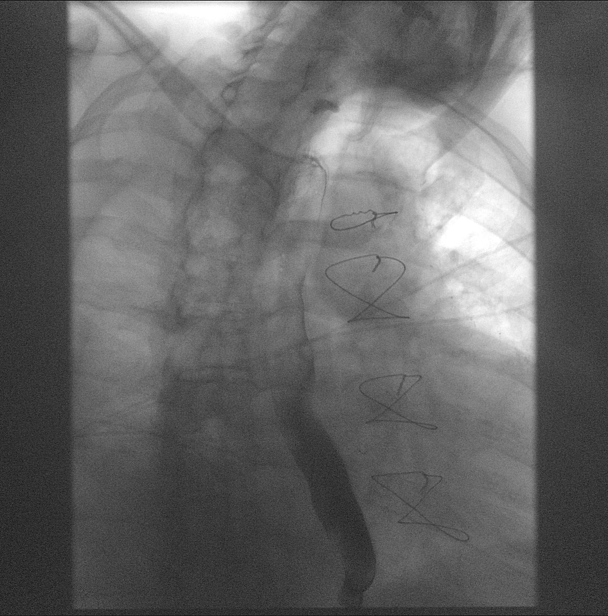
[im 2/3]
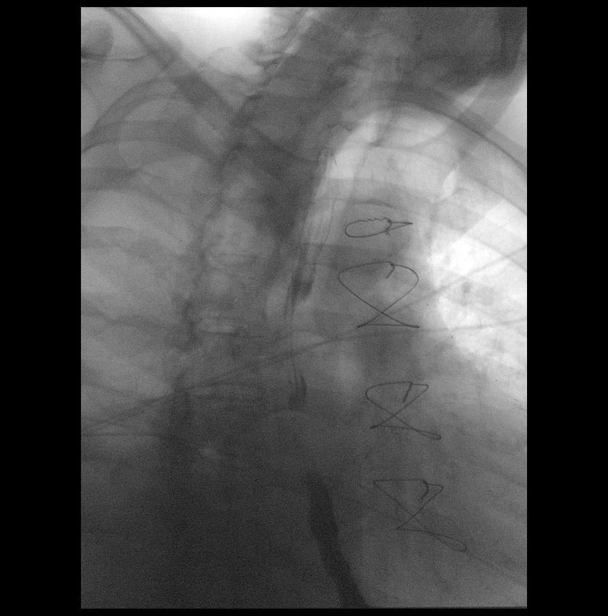
[im 3/3]
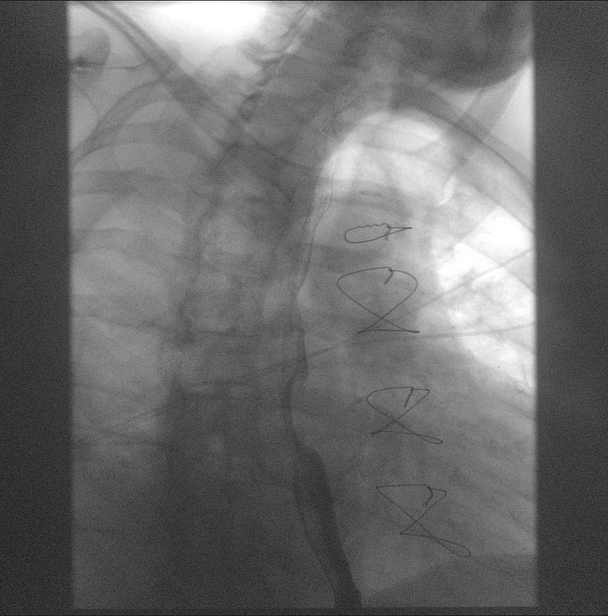

[Series 4: cp_standard · 0.26mm/px · 1 of 1 slices shown (2 of 5)]
[im 1/1]
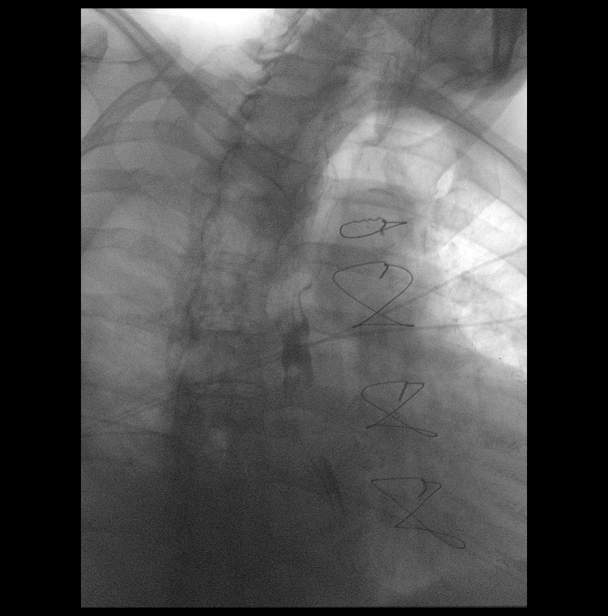

[Series 5: cp_standard · 0.26mm/px · 1 of 1 slices shown (3 of 5)]
[im 1/1]
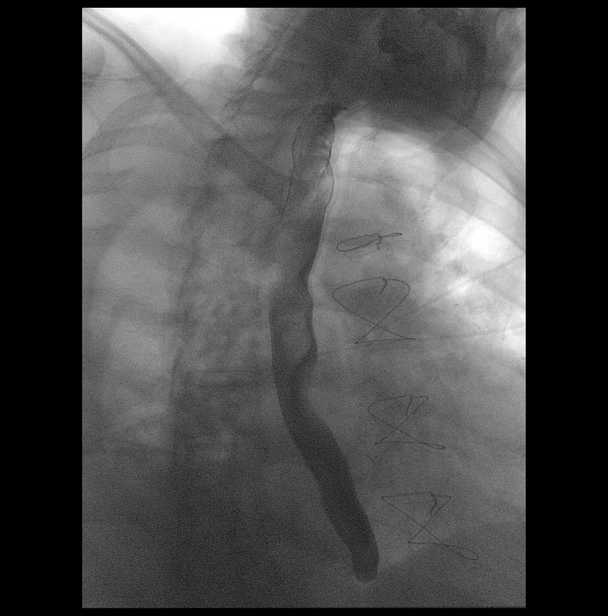

[Series 6: cp_standard · 0.26mm/px · 1 of 1 slices shown (4 of 5)]
[im 1/1]
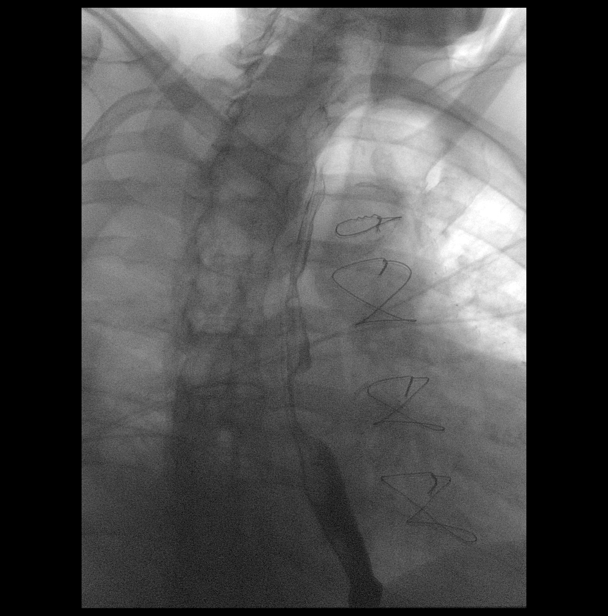

[Series 7: fluoro_barium 2fps_bw · 0.17mm/px · 4 of 8 frames shown (1 of 2)]
[frame 1/8]
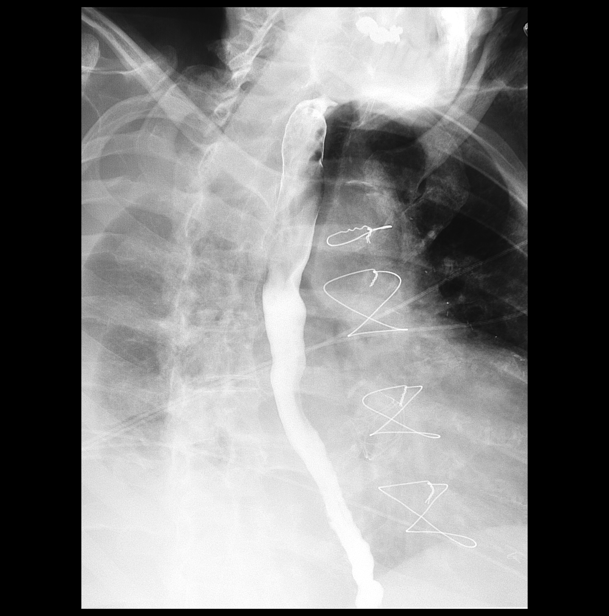
[frame 2/8]
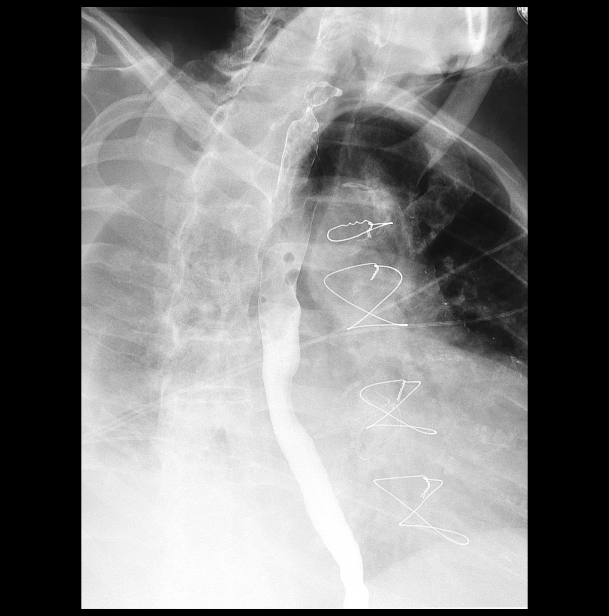
[frame 5/8]
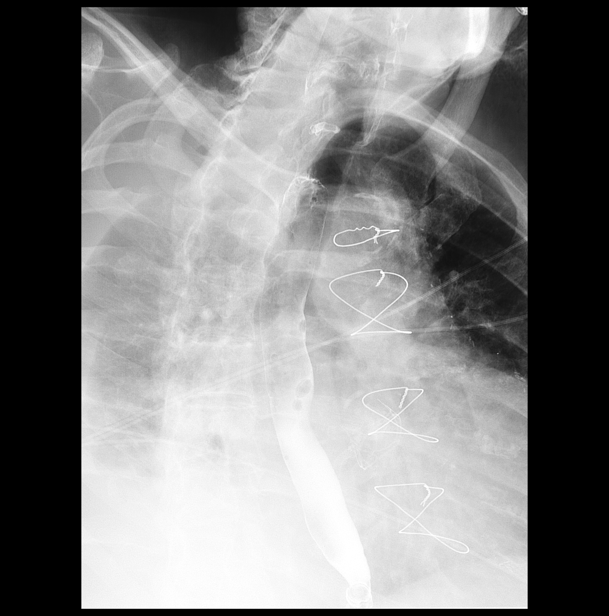
[frame 7/8]
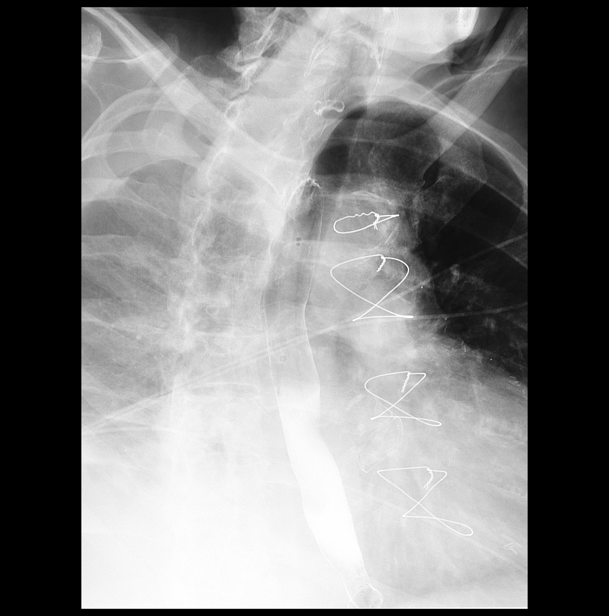

[Series 8: fluoro_barium 2fps_bw · 0.17mm/px · 4 of 13 frames shown (2 of 2)]
[frame 2/13]
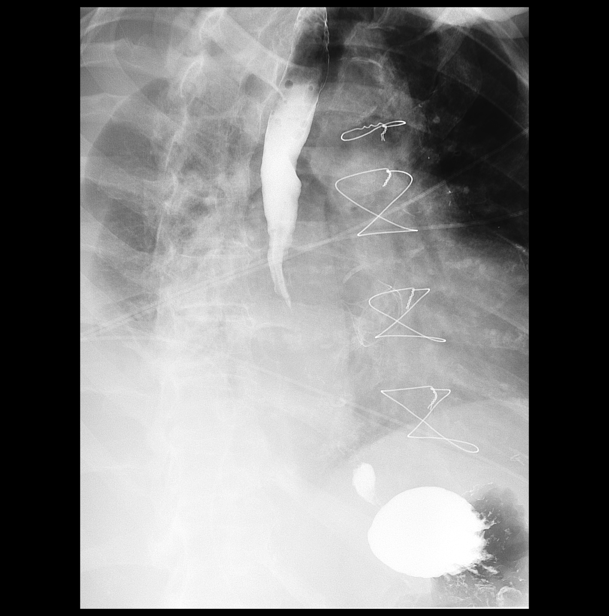
[frame 6/13]
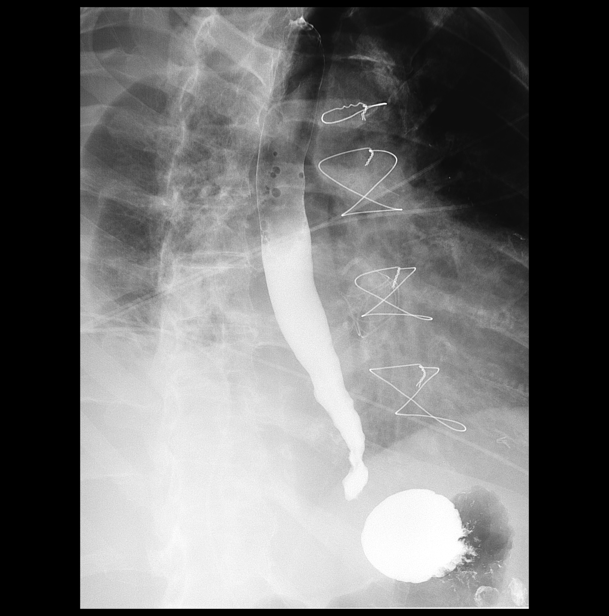
[frame 7/13]
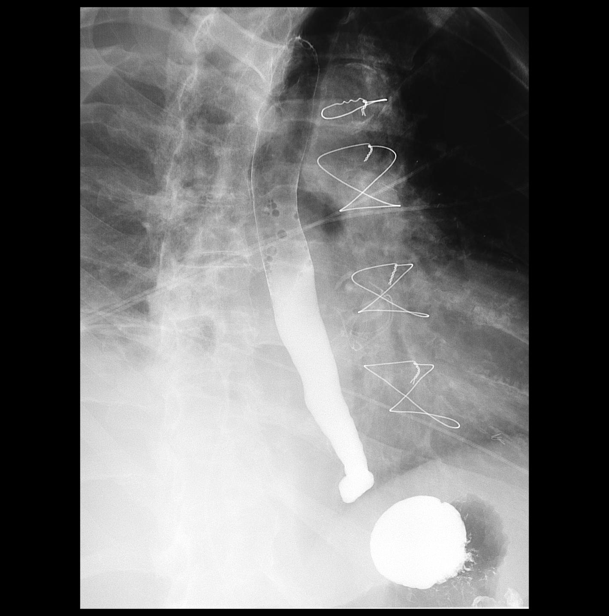
[frame 12/13]
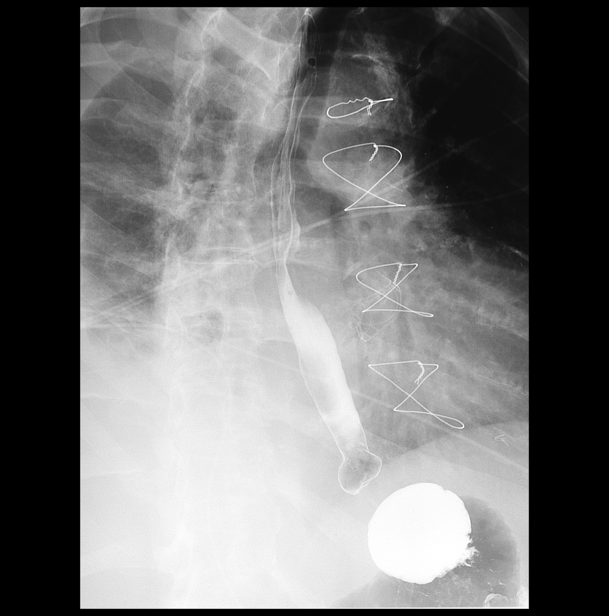

[Series 9: cp_standard · 0.27mm/px · 1 of 1 slices shown (5 of 5)]
[im 1/1]
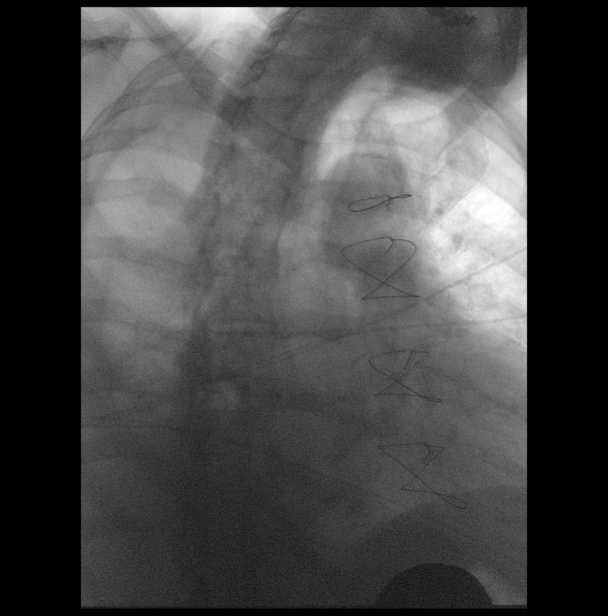

[15 of 15 positions shown; findings below may reference images not displayed]

FINDINGS: The study was performed with the patient supine in the LPO position.
The patient was raised partially for pill

There is no fixed stricture within the esophagus. No fold
thickening. No visible hiatal hernia. The patient swallowed a 13 mm
barium tablet which freely passed into the stomach.
IMPRESSION: No fixed stricture.

## 2016-10-23 ENCOUNTER — Encounter (HOSPITAL_COMMUNITY): Payer: Self-pay | Admitting: Emergency Medicine

## 2016-10-23 ENCOUNTER — Emergency Department (HOSPITAL_COMMUNITY): Payer: Medicare Other

## 2016-10-23 ENCOUNTER — Emergency Department (HOSPITAL_COMMUNITY)
Admission: EM | Admit: 2016-10-23 | Discharge: 2016-10-23 | Disposition: A | Discharge status: Home / Self Care | Payer: Medicare Other | Attending: Emergency Medicine | Admitting: Emergency Medicine

## 2016-10-23 DIAGNOSIS — Z955 Presence of coronary angioplasty implant and graft: Secondary | ICD-10-CM | POA: Insufficient documentation

## 2016-10-23 DIAGNOSIS — S61412A Laceration without foreign body of left hand, initial encounter: Secondary | ICD-10-CM | POA: Diagnosis not present

## 2016-10-23 DIAGNOSIS — I130 Hypertensive heart and chronic kidney disease with heart failure and stage 1 through stage 4 chronic kidney disease, or unspecified chronic kidney disease: Secondary | ICD-10-CM | POA: Diagnosis not present

## 2016-10-23 DIAGNOSIS — Y999 Unspecified external cause status: Secondary | ICD-10-CM | POA: Diagnosis not present

## 2016-10-23 DIAGNOSIS — R930 Abnormal findings on diagnostic imaging of skull and head, not elsewhere classified: Secondary | ICD-10-CM | POA: Insufficient documentation

## 2016-10-23 DIAGNOSIS — S0990XA Unspecified injury of head, initial encounter: Secondary | ICD-10-CM

## 2016-10-23 DIAGNOSIS — I252 Old myocardial infarction: Secondary | ICD-10-CM | POA: Insufficient documentation

## 2016-10-23 DIAGNOSIS — N183 Chronic kidney disease, stage 3 (moderate): Secondary | ICD-10-CM | POA: Diagnosis not present

## 2016-10-23 DIAGNOSIS — S0001XA Abrasion of scalp, initial encounter: Secondary | ICD-10-CM | POA: Insufficient documentation

## 2016-10-23 DIAGNOSIS — Z7982 Long term (current) use of aspirin: Secondary | ICD-10-CM | POA: Diagnosis not present

## 2016-10-23 DIAGNOSIS — Y939 Activity, unspecified: Secondary | ICD-10-CM | POA: Insufficient documentation

## 2016-10-23 DIAGNOSIS — W100XXA Fall (on)(from) escalator, initial encounter: Secondary | ICD-10-CM | POA: Insufficient documentation

## 2016-10-23 DIAGNOSIS — I2510 Atherosclerotic heart disease of native coronary artery without angina pectoris: Secondary | ICD-10-CM | POA: Diagnosis not present

## 2016-10-23 DIAGNOSIS — Y929 Unspecified place or not applicable: Secondary | ICD-10-CM | POA: Insufficient documentation

## 2016-10-23 DIAGNOSIS — W19XXXA Unspecified fall, initial encounter: Secondary | ICD-10-CM

## 2016-10-23 DIAGNOSIS — E1122 Type 2 diabetes mellitus with diabetic chronic kidney disease: Secondary | ICD-10-CM | POA: Insufficient documentation

## 2016-10-23 DIAGNOSIS — I509 Heart failure, unspecified: Secondary | ICD-10-CM | POA: Insufficient documentation

## 2016-10-23 DIAGNOSIS — Z79899 Other long term (current) drug therapy: Secondary | ICD-10-CM | POA: Diagnosis not present

## 2016-10-23 DIAGNOSIS — Z7901 Long term (current) use of anticoagulants: Secondary | ICD-10-CM | POA: Insufficient documentation

## 2016-10-23 DIAGNOSIS — T07XXXA Unspecified multiple injuries, initial encounter: Secondary | ICD-10-CM

## 2016-10-23 MED ORDER — LIDOCAINE HCL (PF) 1 % IJ SOLN
5.0000 mL | Freq: Once | INTRAMUSCULAR | Status: AC
  Administered 2016-10-23: 5 mL via INTRADERMAL
  Filled 2016-10-23: qty 5

## 2016-10-23 NOTE — ED Provider Notes (Signed)
[facility]ENCY DEPT Provider Note   CSN: [id number] Arrival date & time: 10/23/16  1602     History   Chief Complaint Chief Complaint  Patient presents with  . Fall    HPI Omar Walker is a 83 y.o. male.  HPI She was on an escalator with his wife. She lost her balance and they both fell forward. He fell approximately 13 steps. The patient denies he has any significant pain. He does have multiple areas where he has gotten abrasions. Patient does take Coumadin. He did hit his head but denies loss of consciousness. He reports he was able to walk around after it happened. He denies weakness numbness or tingling. Past Medical History:  Diagnosis Date  . AF (atrial fibrillation) (HCC)   . Aortic valve insufficiency   . Aortic valvular disease 12/17/2013  . CHF (congestive heart failure) (HCC)   . Coronary artery disease   . Decreased cardiac ejection fraction 12/17/2013  . DVT, lower extremity (HCC)   . Elevated troponin level 12/17/2013  . Embolism and thrombosis (HCC)   . GERD (gastroesophageal reflux disease)   . Gout    "a few times"  . H/O aorto-femoral bypass 12/17/2013  . H/O cardiac arrest    "3-4 times at least over various years" (12/17/2013)  . H/O hiatal hernia   . High cholesterol   . History of blood transfusion    "think related to aorto operation"  . Hypertension   . Ischemic dilated cardiomyopathy (HCC) 11/15/2014  . NSTEMI (non-ST elevated myocardial infarction) (HCC) 2001; 2013   hx/notes 12/17/2013  . PE (pulmonary embolism)    hx  . Stented coronary artery   . Type II diabetes mellitus [facility])     Patient Active Problem List   Diagnosis Date Noted  . Ischemic dilated cardiomyopathy (HCC) 11/15/2014  . Difficulty swallowing solids   . Dysphagia   . Chronic kidney disease, stage III (moderate) 11/02/2014  . Diabetes mellitus without complication (HCC) 10/31/2014  . Essential hypertension 10/31/2014  . Hemoptysis 10/31/2014  . Difficulty swallowing  10/31/2014  . Cough 10/31/2014  . Aspiration pneumonia (HCC) 10/31/2014  . CHF (congestive heart failure) (HCC) 12/17/2013  . PE (pulmonary embolism) 12/17/2013  . H/O cardiac arrest 12/17/2013  . H/O aorto-femoral bypass 12/17/2013  . Coronary artery disease 12/17/2013  . Diabetes mellitus (HCC) 12/17/2013  . Gout 12/17/2013  . Decreased cardiac ejection fraction 12/17/2013  . Aortic valvular disease 12/17/2013  . Elevated troponin level 12/17/2013  . NSTEMI (non-ST elevated myocardial infarction) (HCC) 12/17/2013    Past Surgical History:  Procedure Laterality Date  . AORTA - BILATERAL FEMORAL ARTERY BYPASS GRAFT  1990's   "feet had turned black"  . APPENDECTOMY    . CARDIAC CATHETERIZATION    . CORONARY ANGIOPLASTY WITH STENT PLACEMENT  ?2001   "I have a couple stents in my heart"  . GLAUCOMA SURGERY Right    "took cataract off; drilled hole in eyeball to relieve pressure"  . TIBIA FRACTURE SURGERY Right    "just below the knee"       Home Medications    Prior to Admission medications   Medication Sig Start Date End Date Taking? Authorizing Provider  allopurinol (ZYLOPRIM) 300 MG tablet Take 150 mg by mouth daily.    Historical Provider, MD  aspirin EC 81 MG tablet Take 81 mg by mouth daily.    Historical Provider, MD  carvedilol (COREG) 6.25 MG tablet Take 1 tablet (6.25 mg total) by mouth  2 (two) times daily with a meal. 11/04/14   Renae Fickle, MD  digoxin (LANOXIN) 0.125 MG tablet Take 0.125 mg by mouth daily.    Historical Provider, MD  furosemide (LASIX) 40 MG tablet Take 1 tablet (40 mg total) by mouth daily. 11/08/14   Tonny Bollman, MD  LEVEMIR FLEXTOUCH 100 UNIT/ML Pen Inject 100 Units as directed daily. Inject 15 units into the skin in the am 10/23/14   Historical Provider, MD  ONE TOUCH ULTRA TEST test strip  10/26/14   Historical Provider, MD  PARoxetine (PAXIL) 10 MG tablet Take 10 mg by mouth daily.    Historical Provider, MD  potassium chloride 20 MEQ  TBCR Take 20 mEq by mouth daily. 11/04/14   Renae Fickle, MD  saccharomyces boulardii (FLORASTOR) 250 MG capsule Take 1 capsule (250 mg total) by mouth 2 (two) times daily. 11/04/14   Renae Fickle, MD  spironolactone (ALDACTONE) 25 MG tablet Take 25 mg by mouth daily.    Historical Provider, MD  warfarin (COUMADIN) 5 MG tablet Take 0.5 tablets (2.5 mg total) by mouth daily at 6 PM. 11/04/14   Renae Fickle, MD    Family History No family history on file.  Social History Social History  Substance Use Topics  . Smoking status: Never Smoker  . Smokeless tobacco: Never Used  . Alcohol use Yes     Comment: 12/17/2013 "drink 1-2 beers on occasion in the summertime"     Allergies   Beef-derived products   Review of Systems Review of Systems 10 Systems reviewed and are negative for acute change except as noted in the HPI.   Physical Exam Updated Vital Signs Temp 98.1 F (36.7 C) (Oral)   SpO2 97%   Physical Exam  Constitutional: He is oriented to person, place, and time. He appears well-developed and well-nourished.  HENT:  Mouth/Throat: Oropharynx is clear and moist.  Minor superficial abrasion to the left parietal area of the scalp. No active bleeding. No associated hematoma. These are less than half centimeter. One is behind the ear and the other slightly more towards the vertex. No facial oral or dental injury.  Eyes: Conjunctivae and EOM are normal. Pupils are equal, round, and reactive to light.  Neck: Neck supple.  No C-spine tenderness to palpation.  Cardiovascular: Normal rate, regular rhythm, normal heart sounds and intact distal pulses.   No murmur heard. Pulmonary/Chest: Effort normal and breath sounds normal. No respiratory distress. He exhibits no tenderness.  Abdominal: Soft. There is no tenderness.  Musculoskeletal: He exhibits no edema.  Patient has contusions to bilateral knees. No effusion or deformity however her facial abrasions to the infrapatellar  area approximately 2 cm in length. No effusion or hematoma. Patient has 3 lacerations on the left palm. 2 are V-shaped. The largest one is approximately 1 x 1 cm. Doesn't go into the deep subcutaneous fat. Second one is smaller less than a half centimeter by half centimeter. This just use the superficial dermal tissues without going into the deep dermis. Third is a deep abrasion. Hand otherwise has normal function without any deformity.  Neurological: He is alert and oriented to person, place, and time. He exhibits normal muscle tone. Coordination normal.  Skin: Skin is warm and dry.  Psychiatric: He has a normal mood and affect.  Nursing note and vitals reviewed.    ED Treatments / Results  Labs (all labs ordered are listed, but only abnormal results are displayed) Labs Reviewed - No data to display  EKG  EKG Interpretation None       Radiology Ct Head Wo Contrast  Result Date: 10/23/2016 CLINICAL DATA:  Patient fell down asked earlier. Laceration posterior scalp. EXAM: CT HEAD WITHOUT CONTRAST TECHNIQUE: Contiguous axial images were obtained from the base of the skull through the vertex without intravenous contrast. COMPARISON:  None. FINDINGS: Brain: There is no evidence for acute hemorrhage, hydrocephalus, mass lesion, or abnormal extra-axial fluid collection. No definite CT evidence for acute infarction. Diffuse loss of parenchymal volume is consistent with atrophy. Patchy low attenuation in the deep hemispheric and periventricular white matter is nonspecific, but likely reflects chronic microvascular ischemic demyelination. Vascular: Atherosclerotic calcification is visualized in the carotid arteries. No dense MCA sign. Major dural sinuses are unremarkable. Skull: No evidence for fracture. No worrisome lytic or sclerotic lesion. Sinuses/Orbits: Fluid identified in the left mastoid air cells and left middle ear. Visualized paranasal sinuses are clear. Visualized portions of the globes and  intraorbital fat are unremarkable. Other: None. IMPRESSION: 1. No acute intracranial abnormality. 2. Atrophy with chronic small vessel white matter ischemic disease. 3. Left mastoid air cell effusion. Electronically Signed   By: Kennith CenterEric  Mansell M.D.   On: 10/23/2016 18:48    Procedures .Marland Kitchen.Laceration Repair Date/Time: 10/23/2016 6:47 PM Performed by: Arby BarrettePFEIFFER, Aris Moman Authorized by: Arby BarrettePFEIFFER, Maeleigh Buschman   Consent:    Consent obtained:  Verbal   Consent given by:  Patient Anesthesia (see MAR for exact dosages):    Anesthesia method:  Local infiltration   Local anesthetic:  Lidocaine 1% w/o epi Laceration details:    Location:  Hand   Hand location:  L palm   Length (cm):  2   Depth (mm):  5 Repair type:    Repair type:  Simple Pre-procedure details:    Preparation:  Patient was prepped and draped in usual sterile fashion Exploration:    Contaminated: no   Treatment:    Area cleansed with:  Shur-Clens and saline   Amount of cleaning:  Standard Skin repair:    Repair method:  Sutures   Suture size:  5-0   Suture material:  Prolene   Suture technique:  Simple interrupted   Number of sutures:  3 Approximation:    Approximation:  Close   Vermilion border: well-aligned   Post-procedure details:    Dressing:  Antibiotic ointment   Patient tolerance of procedure:  Tolerated well, no immediate complications   (including critical care time)  Medications Ordered in ED Medications  lidocaine (PF) (XYLOCAINE) 1 % injection 5 mL (not administered)     Initial Impression / Assessment and Plan / ED Course  I have reviewed the triage vital signs and the nursing notes.  Pertinent labs & imaging results that were available during my care of the patient were reviewed by me and considered in my medical decision making (see chart for details).      Final Clinical Impressions(s) / ED Diagnoses   Final diagnoses:  Fall, initial encounter  Injury of head, initial encounter  Multiple  abrasions  Laceration of left hand without foreign body, initial encounter  Anticoagulated on Coumadin   Patient is alert and appropriate. He is neurologically intact. Wounds have been cleaned and dressed. Diagnostic evaluation does not show any evidence of intracranial injury. Family members are present for assisting in care. Return precautions reviewed.  New Prescriptions New Prescriptions   No medications on file     Arby BarretteMarcy Lynsay Fesperman, MD 10/23/16 1900

## 2016-10-23 NOTE — ED Triage Notes (Signed)
Pt BIB EMS from [place] [employer] [facility] where pt had mechanical fall coming down the escalator. Pt reports "me and my wife got tripped up on each other." Pt on Warfarin. Lac noted to rt posterior head, left palm, rt lower leg; bleeding controlled. Pt amulatory, reports rt hip pain. Denies LOC. Resp e/u; NAD noted at this time. MD at bedside.

## 2016-10-23 NOTE — ED Notes (Signed)
Patient transported to CT 

## 2016-10-23 NOTE — ED Notes (Signed)
Wounds cleansed and open to air.

## 2016-10-23 NOTE — ED Notes (Signed)
Dressings applied by Darrel, EMT/Tech.

## 2016-10-26 ENCOUNTER — Telehealth: Payer: Self-pay | Admitting: Cardiology

## 2016-10-26 NOTE — Telephone Encounter (Signed)
Follow Up    Kendal HymenBonnie (roberts daughter) called back [phone].  Prev message: Pt daughter is concerned that her father fell head over heels down an escalator, she thinks that maybe his pacemaker was damaged, spoke with [place] in device we do not monitor or have any information about his pacemaker,  I gave pt daughter our fax number to have his doctor to fax the information to us.   Do you want the pt to come in for a appt?  [facility]

## 2016-10-26 NOTE — Telephone Encounter (Signed)
Left message to call back  

## 2016-10-26 NOTE — Telephone Encounter (Signed)
New Message    Pt daughter is concerned that her father fell head over heels down an escalator, she thinks that maybe his pacemaker was damaged, spoke with [place] in device we do not monitor or have any information about his pacemaker,  I gave pt daughter our fax number to have his doctor to fax the information to us.   Do you want the pt to come in for a appt?  [facility]

## 2016-10-27 NOTE — Telephone Encounter (Signed)
Pt's daughter Felizardo HoffmannBonnie Depee ([phone]) returning your call.

## 2016-10-27 NOTE — Telephone Encounter (Signed)
Informed patient that minimal records were received. She understands they will sign paperwork tomorrow to retrieve more records. She was grateful for call.  Records placed in [name]'s box for review.

## 2016-10-27 NOTE — Telephone Encounter (Addendum)
Patient's daughter (DPR) reports the patient fell down an escalator the other day and went to the hospital (see encounter). She states he got a few stitches and his hip is bruised. He does not have a PCP here - instructed her to take him to Urgent Care since she is concerned about his hip and to try to establish with PCP ASAP. He has been on Coumadin long term for afib for years. He is currently taking Coumadin 6 mg daily.  This was followed by his family medical doctor in the past (records also in EP[facility]). His INR was last checked in [place] 2/9 and she does not remember the reading. Since seeing Dr. Mayford Knifeurner 11/2014, the patient got "some sort of device" in his chest in [place]. His daughter is not sure if it is a PM or defibrillator. She states the patient is not having any cardiac symptoms, but is worried something could be wrong with his device from the fall.The patient just came back from [place] within the last week and does not have an EP doctor in [place]. They also do not currently have plans for INR checks.  Scheduled patient for [facility] tomorrow to establish regular INR checks. Scheduled patient with Iver NestleBhagat, PA tomorrow as well for overdue office visit/check-up. DPR understands the patient will be referred to EP tomorrow to establish for device monitoring. DPR agrees with treatment plan.  [facility] [place], [place].

## 2016-10-28 ENCOUNTER — Ambulatory Visit (INDEPENDENT_AMBULATORY_CARE_PROVIDER_SITE_OTHER): Payer: Medicare Other

## 2016-10-28 ENCOUNTER — Ambulatory Visit (INDEPENDENT_AMBULATORY_CARE_PROVIDER_SITE_OTHER): Payer: Medicare Other | Admitting: Physician Assistant

## 2016-10-28 ENCOUNTER — Encounter: Payer: Self-pay | Admitting: Physician Assistant

## 2016-10-28 ENCOUNTER — Ambulatory Visit (INDEPENDENT_AMBULATORY_CARE_PROVIDER_SITE_OTHER): Payer: Medicare Other | Admitting: *Deleted

## 2016-10-28 VITALS — BP 124/68 | HR 75 | Ht 70.5 in | Wt 197.0 lb

## 2016-10-28 DIAGNOSIS — Z95810 Presence of automatic (implantable) cardiac defibrillator: Secondary | ICD-10-CM

## 2016-10-28 DIAGNOSIS — I2583 Coronary atherosclerosis due to lipid rich plaque: Secondary | ICD-10-CM | POA: Diagnosis not present

## 2016-10-28 DIAGNOSIS — I5042 Chronic combined systolic (congestive) and diastolic (congestive) heart failure: Secondary | ICD-10-CM

## 2016-10-28 DIAGNOSIS — I2510 Atherosclerotic heart disease of native coronary artery without angina pectoris: Secondary | ICD-10-CM

## 2016-10-28 DIAGNOSIS — I4819 Other persistent atrial fibrillation: Secondary | ICD-10-CM | POA: Insufficient documentation

## 2016-10-28 DIAGNOSIS — I10 Essential (primary) hypertension: Secondary | ICD-10-CM

## 2016-10-28 DIAGNOSIS — Z5181 Encounter for therapeutic drug level monitoring: Secondary | ICD-10-CM | POA: Diagnosis not present

## 2016-10-28 DIAGNOSIS — I4891 Unspecified atrial fibrillation: Secondary | ICD-10-CM | POA: Diagnosis not present

## 2016-10-28 DIAGNOSIS — I359 Nonrheumatic aortic valve disorder, unspecified: Secondary | ICD-10-CM

## 2016-10-28 DIAGNOSIS — T07XXXA Unspecified multiple injuries, initial encounter: Secondary | ICD-10-CM

## 2016-10-28 DIAGNOSIS — I5023 Acute on chronic systolic (congestive) heart failure: Secondary | ICD-10-CM

## 2016-10-28 DIAGNOSIS — I255 Ischemic cardiomyopathy: Secondary | ICD-10-CM

## 2016-10-28 DIAGNOSIS — I420 Dilated cardiomyopathy: Secondary | ICD-10-CM

## 2016-10-28 LAB — POCT INR: INR: 2.5

## 2016-10-28 NOTE — Patient Instructions (Addendum)
Medication Instructions:  Your physician recommends that you continue on your current medications as directed. Please refer to the Current Medication list given to you today.   Labwork: TODAY:  CBC & BMET  Testing/Procedures: None ordered  Follow-Up: Your physician recommends that you schedule a follow-up appointment in: 1 MONTH WITH DR. Mayford KnifeURNER (NO APP)   Any Other Special Instructions Will Be Listed Below (If Applicable).     If you need a refill on your cardiac medications before your next appointment, please call your pharmacy.

## 2016-10-28 NOTE — Progress Notes (Signed)
CRT-D device check in office, added on per V. Bhagat, PA.  [place] clinic [place], no changes today, device interrogation to ensure that device and leads functioning appropriately after a recent fall.   Thresholds and sensing consistent with previous device measurements. Lead impedance trends stable over time. No mode switch episodes recorded. No ventricular arrhythmia episodes recorded. Patient bi-ventricularly pacing 97% of the time. Device programmed with appropriate safety margins. Heart failure diagnostics reviewed, thoracic impedance abnormal today x15 days, patient and family deny any symptoms or weight gain. Patient encouraged to f/u with cardiologist if he develops symptoms. No changes made this session. Estimated longevity 3.1-3.4 years.]  Interrogation reviewed by Dr. Graciela HusbandsKlein.  No recommended changes.

## 2016-10-28 NOTE — Progress Notes (Signed)
Cardiology Office Note    Date:  10/28/2016   ID:  [place], DOB [birth year], MRN [id number]  PCP:  Rayburn Felt, NP  Cardiologist:  Dr. Mayford Knife  Chief Complaint: ER follow up after fall   History of Present Illness:   [place] is a 83 y.o. male CAD s/p CABG x 5 and AVR (st. Jude Trifecta Bovine)for aortic valve disease in [place], PVD s/p aortofemoral bypass, prior cardiac arrest, chronic systolic CHF, ICM s/p ICD, LBBB, DM, HTN, CKD stage III,  PAF on coumadin for anticoagulation, PE, DVT and HLD presents for follow up.   Hx of CAD with stenting to RCA in 2001 now s/p CABG 06/2014 x 5 (LIMA to LAD, SVG to MI-PLRCA-PDA and D1).  Echo 02/05/15 @ [place] showed EF of 20%, mild TR, mild LVH, mild MR, mild pulmonary HTN, LV size normal, normal aortic valve function.   Patient apparently lives in [place] and has cardiologist there. Comes here to [facility] for the winters. Last seen by Dr. Mayford Knife  11/15/14. LV Ef of 15% on echo 10/23/14.   Pt has St. Jude ICD placed 02/13/15 in [place]. Pt has been followed at [facility] by Dr. Lamont Dowdy. Last seen 10/11/16. Normal device function at that time.   Patient did not came to [place] last year. He came her 10/19/16. The patient fell down an escalator 10/23/16 and hit his L hip. No LOC. He tripped over. Patient was evaluated in ER and discharged home. No INR, CBC or the med check. Patient was seen in medical urgent care yesterday for follow-up. Felt stable however family noted extending bruising from hip to lower leg on the right side. He denies shortness of breath, chest pain, palpitation, syncope, orthopnea, PND, melena, blood in his stool or urine. Compliant with medication and salt intake. Patient has noted right lower extremity swelling since fall.   Past Medical History:  Diagnosis Date  . AF (atrial fibrillation) (HCC)   . Aortic valve insufficiency   . Aortic valvular disease 12/17/2013  . CHF (congestive heart failure) (HCC)   . Coronary  artery disease   . Decreased cardiac ejection fraction 12/17/2013  . DVT, lower extremity (HCC)   . Elevated troponin level 12/17/2013  . Embolism and thrombosis (HCC)   . GERD (gastroesophageal reflux disease)   . Gout    "a few times"  . H/O aorto-femoral bypass 12/17/2013  . H/O cardiac arrest    "3-4 times at least over various years" (12/17/2013)  . H/O hiatal hernia   . High cholesterol   . History of blood transfusion    "think related to aorto operation"  . Hypertension   . Ischemic dilated cardiomyopathy (HCC) 11/15/2014  . NSTEMI (non-ST elevated myocardial infarction) (HCC) 2001; 2013   hx/notes 12/17/2013  . PE (pulmonary embolism)    hx  . Stented coronary artery   . Type II diabetes mellitus (HCC)     Past Surgical History:  Procedure Laterality Date  . AORTA - BILATERAL FEMORAL ARTERY BYPASS GRAFT  1990's   "feet had turned black"  . APPENDECTOMY    . CARDIAC CATHETERIZATION    . CORONARY ANGIOPLASTY WITH STENT PLACEMENT  ?2001   "I have a couple stents in my heart"  . GLAUCOMA SURGERY Right    "took cataract off; drilled hole in eyeball to relieve pressure"  . TIBIA FRACTURE SURGERY Right    "just below the knee"    Current Medications:  Prior to Admission medications   Medication Sig Start Date End Date Taking? Authorizing Provider  allopurinol (ZYLOPRIM) 300 MG tablet Take 150 mg by mouth daily.   Yes Historical Provider, MD  aspirin EC 81 MG tablet Take 81 mg by mouth daily.   Yes Historical Provider, MD  atorvastatin (LIPITOR) 10 MG tablet Take 10 mg by mouth daily.   Yes Historical Provider, MD  carvedilol (COREG) 3.125 MG tablet Take 3.125 mg by mouth 2 (two) times daily with a meal.   Yes Historical Provider, MD  digoxin (LANOXIN) 0.125 MG tablet Take 0.125 mg by mouth daily.   Yes Historical Provider, MD  insulin aspart (NOVOLOG) 100 UNIT/ML injection Inject 32 Units into the skin 2 (two) times daily. As directed   Yes Historical Provider, MD    linagliptin (TRADJENTA) 5 MG TABS tablet Take 5 mg by mouth daily.   Yes Historical Provider, MD  lisinopril (PRINIVIL,ZESTRIL) 2.5 MG tablet Take 2.5 mg by mouth daily.   Yes Historical Provider, MD  PARoxetine (PAXIL) 10 MG tablet Take 10 mg by mouth daily.   Yes Historical Provider, MD  potassium chloride 20 MEQ TBCR Take 20 mEq by mouth daily. 11/04/14  Yes Renae Fickle, MD  spironolactone (ALDACTONE) 25 MG tablet Take 25 mg by mouth daily.   Yes Historical Provider, MD  timolol (BETIMOL) 0.5 % ophthalmic solution Place 1 drop into the left eye daily.   Yes Historical Provider, MD  warfarin (COUMADIN) 5 MG tablet Take 0.5 tablets (2.5 mg total) by mouth daily at 6 PM. 11/04/14  Yes Renae Fickle, MD    Allergies:   Beef-derived products   Social History   Social History  . Marital status: Married    Spouse name: N/A  . Number of children: N/A  . Years of education: N/A   Social History Main Topics  . Smoking status: Never Smoker  . Smokeless tobacco: Never Used  . Alcohol use Yes     Comment: 12/17/2013 "drink 1-2 beers on occasion in the summertime"  . Drug use: No  . Sexual activity: No   Other Topics Concern  . None   Social History Narrative  . None     Family History:  The patient's family history is not on file.  ROS:   Please see the history of present illness.    ROS All other systems reviewed and are negative.   PHYSICAL EXAM:   VS:  BP 124/68   Pulse 75   Ht 5' 10.5" (1.791 m)   Wt 197 lb (89.4 kg)   BMI 27.87 kg/m    GEN: Well nourished, well developed, in no acute distress  HEENT: normal  Neck: no JVD, carotid bruits, or masses Cardiac: RRR; no murmurs, rubs, or gallops Extremity: , Right side bruising starting R hip to R lower leg. R+ edema on R leg and 1 + edema on L leg Respiratory:  clear to auscultation bilaterally, normal work of breathing GI: soft, nontender, nondistended, + BS MS: no deformity or atrophy  Skin: warm and dry, no  rash Neuro:  Alert and Oriented x 3, Strength and sensation are intact Psych: euthymic mood, full affect  Wt Readings from Last 3 Encounters:  10/28/16 197 lb (89.4 kg)  11/15/14 166 lb (75.3 kg)  11/04/14 186 lb 4.6 oz (84.5 kg)      Studies/Labs Reviewed:   EKG:  EKG is ordered today.  The ekg ordered today demonstrates Sinus rhythm with ventricular pacing.  Recent Labs: No results found for requested labs within last 8760 hours.   Lipid Panel    Component Value Date/Time   CHOL 155 11/01/2014 0550   TRIG 72 11/01/2014 0550   HDL 27 (L) 11/01/2014 0550   CHOLHDL 5.7 11/01/2014 0550   VLDL 14 11/01/2014 0550   LDLCALC 114 (H) 11/01/2014 0550    Additional studies/ records that were reviewed today include:   Echocardiogram: 10/23/14 Study Conclusions  - Left ventricle: The cavity size was mildly dilated. There was mild concentric hypertrophy. Systolic function was severely reduced. The estimated ejection fraction was 15-20%. Severe diffuse hypokinesis. - Aortic valve: A bioprosthesis was present. - Mitral valve: Calcified annulus. There was trivial regurgitation. - Left atrium: The atrium was moderately to severely dilated. - Tricuspid valve: There was mild-moderate regurgitation. - Pulmonic valve: There was mild regurgitation. - Pulmonary arteries: PA peak pressure: 43 mm Hg (S).   ASSESSMENT & PLAN:    1. CAD s/p CABG x 5 in 2015 - No angina. Continue Continue  2. ICM s/p ICD (st. Jude 02/2015) - Last device check in [place] 10/11/16 was normal. Review of echo showed (last 10/23/14) LV Ef of 15%. Continue current medication. Will check BMET as she is on diuretics chronically. Device check done today. Will send result to Dr. Mayford Knifeurner.   3. S/p AVR (st. Jude Trifecta Bovine) at time on CABG  4. Recent fall - R hip bruise extending to lower leg. INR today 2.5. Check CBC.   I have spent over 45 minutes, face-to-face with the patient and over 50% was spent in  counseling and/or coordination of care. This includes review of prior records, care discussion with Dr. Mayford Knifeurner,  need of magine agent, developing & discussing different plans, discussion of disease and its complications, life style changes.   Medication Adjustments/Labs and Tests Ordered: Current medicines are reviewed at length with the patient today.  Concerns regarding medicines are outlined above.  Medication changes, Labs and Tests ordered today are listed in the Patient Instructions below. Patient Instructions  Medication Instructions:  Your physician recommends that you continue on your current medications as directed. Please refer to the Current Medication list given to you today.   Labwork: TODAY:  CBC & BMET  Testing/Procedures: None ordered  Follow-Up: Your physician recommends that you schedule a follow-up appointment in: 1 MONTH WITH DR. Mayford KnifeURNER (NO APP)   Any Other Special Instructions Will Be Listed Below (If Applicable).     If you need a refill on your cardiac medications before your next appointment, please call your pharmacy.      Lorelei PontSigned, Emilina Smarr, [place]  10/28/2016 12:26 PM    [facility] [address], [place], [place]  [address] Phone: [phone]; Fax: [phone]

## 2016-10-28 NOTE — Patient Instructions (Signed)

## 2016-10-29 ENCOUNTER — Telehealth: Payer: Self-pay | Admitting: Cardiology

## 2016-10-29 LAB — BASIC METABOLIC PANEL
BUN / CREAT RATIO: 18 (ref 10–24)
BUN: 24 mg/dL (ref 8–27)
CHLORIDE: 99 mmol/L (ref 96–106)
CO2: 22 mmol/L (ref 18–29)
Calcium: 9 mg/dL (ref 8.6–10.2)
Creatinine, Ser: 1.35 mg/dL — ABNORMAL HIGH (ref 0.76–1.27)
GFR calc non Af Amer: 47 — ABNORMAL LOW (ref >59)
GFR, EST AFRICAN AMERICAN: 55 — AB (ref >59)
GLUCOSE: 149 mg/dL — AB (ref 65–99)
Potassium: 4.5 mmol/L (ref 3.5–5.2)
SODIUM: 136 mmol/L (ref 134–144)

## 2016-10-29 LAB — CBC
Hematocrit: 32.3 % — ABNORMAL LOW (ref 37.5–51.0)
Hemoglobin: 10.5 g/dL — ABNORMAL LOW (ref 13.0–17.7)
MCH: 30.4 pg (ref 26.6–33.0)
MCHC: 32.5 g/dL (ref 31.5–35.7)
MCV: 94 fL (ref 79–97)
PLATELETS: 205 10*3/uL (ref 150–379)
RBC: 3.45 x10E6/uL — ABNORMAL LOW (ref 4.14–5.80)
RDW: 15.8 % — ABNORMAL HIGH (ref 12.3–15.4)
WBC: 8.6 10*3/uL (ref 3.4–10.8)

## 2016-10-29 NOTE — Telephone Encounter (Signed)
°  Follow Up ° ° °Returning call regarding test results. Please call. °

## 2016-10-29 NOTE — Telephone Encounter (Signed)
Informed patient's DPR of results and verbal understanding expressed.   

## 2016-10-29 NOTE — Telephone Encounter (Signed)
-----   Message from GermantonBhavinkumar Bhagat, [place] sent at 10/29/2016  6:58 AM EST ----- Scr stable. Normal electrolytes. HGb 10.5 (lower than last records about 1 year ago). Let us know if bruise continues to get worse, may need repeat HGb check at that time.

## 2016-11-10 ENCOUNTER — Telehealth: Payer: Self-pay | Admitting: Cardiology

## 2016-11-10 NOTE — Telephone Encounter (Signed)
Left message to call back  

## 2016-11-10 NOTE — Telephone Encounter (Signed)
Patient's daughter called to report the patient is "coughing stuff up." She reports no other symptoms, but was told to notify Cardiology if this happens.  She does not know the color of the sputum as her dad just told her about it, she hasn't seen it.  She said he has no swelling and actually is doing quite well. He is much better since his fall. She reports he weighed two pounds heavier today than yesterday, but he ate a giant birthday meal for dinner last night.  He is taking his medications as instructed.  Instructed her to continue to monitor patient's weight and to call if he gains 3 pounds in a day or 5 pounds in a week.  Since he is having no other symptoms, instructed her to monitor sputum and to call PCP if it is yellow or green. She understands she'll be called if Dr. Mayford Knifeurner has any further instructions.

## 2016-11-10 NOTE — Telephone Encounter (Signed)
Follow Up:; ° ° °Returning your call. °

## 2016-11-10 NOTE — Telephone Encounter (Signed)
New Message   Pt daughter states he had defib checked a week ago. Print out states he is going into heart failure and it sounds like he is coughing thngs up. She is requesting a call back.

## 2016-11-11 ENCOUNTER — Ambulatory Visit (INDEPENDENT_AMBULATORY_CARE_PROVIDER_SITE_OTHER): Payer: Medicare Other | Admitting: *Deleted

## 2016-11-11 DIAGNOSIS — I4891 Unspecified atrial fibrillation: Secondary | ICD-10-CM | POA: Diagnosis not present

## 2016-11-11 DIAGNOSIS — I420 Dilated cardiomyopathy: Secondary | ICD-10-CM

## 2016-11-11 DIAGNOSIS — I255 Ischemic cardiomyopathy: Secondary | ICD-10-CM

## 2016-11-11 DIAGNOSIS — Z5181 Encounter for therapeutic drug level monitoring: Secondary | ICD-10-CM | POA: Diagnosis not present

## 2016-11-11 LAB — POCT INR: INR: 2.4

## 2016-11-17 ENCOUNTER — Encounter: Payer: Self-pay | Admitting: Cardiology

## 2016-11-25 ENCOUNTER — Encounter (INDEPENDENT_AMBULATORY_CARE_PROVIDER_SITE_OTHER): Payer: Self-pay

## 2016-11-25 ENCOUNTER — Encounter: Payer: Self-pay | Admitting: Cardiology

## 2016-11-25 ENCOUNTER — Ambulatory Visit (INDEPENDENT_AMBULATORY_CARE_PROVIDER_SITE_OTHER): Payer: Medicare Other | Admitting: Cardiology

## 2016-11-25 VITALS — BP 100/60 | HR 81 | Ht 70.5 in | Wt 193.0 lb

## 2016-11-25 DIAGNOSIS — I481 Persistent atrial fibrillation: Secondary | ICD-10-CM | POA: Diagnosis not present

## 2016-11-25 DIAGNOSIS — I420 Dilated cardiomyopathy: Secondary | ICD-10-CM | POA: Diagnosis not present

## 2016-11-25 DIAGNOSIS — I5022 Chronic systolic (congestive) heart failure: Secondary | ICD-10-CM | POA: Diagnosis not present

## 2016-11-25 DIAGNOSIS — I359 Nonrheumatic aortic valve disorder, unspecified: Secondary | ICD-10-CM | POA: Diagnosis not present

## 2016-11-25 DIAGNOSIS — I2510 Atherosclerotic heart disease of native coronary artery without angina pectoris: Secondary | ICD-10-CM

## 2016-11-25 DIAGNOSIS — E119 Type 2 diabetes mellitus without complications: Secondary | ICD-10-CM | POA: Diagnosis not present

## 2016-11-25 DIAGNOSIS — N183 Chronic kidney disease, stage 3 unspecified: Secondary | ICD-10-CM

## 2016-11-25 DIAGNOSIS — I255 Ischemic cardiomyopathy: Secondary | ICD-10-CM

## 2016-11-25 DIAGNOSIS — I4819 Other persistent atrial fibrillation: Secondary | ICD-10-CM

## 2016-11-25 NOTE — Patient Instructions (Signed)

## 2016-11-25 NOTE — Progress Notes (Signed)
Cardiology Office Note    Date:  11/25/2016   ID:  [place], DOB 04-08-1928, MRN [id number]  PCP:  [place] Felt, NP  Cardiologist:  [place] Magic, MD   Chief Complaint  Patient presents with  . Coronary Artery Disease  . Atrial Fibrillation  . Hyperlipidemia  . Congestive Heart Failure  . Cardiomyopathy    History of Present Illness:  [place] is a 82 y.o. male with a history of CAD with prior MI with PCI in 2001, repeat NSTEMI in 2013 and then s/p CABG x 5 with AVR in [place].  He also has a history of DM, HTN, aortic valve disease s/p AVR, low EF, persistent  AF, PE, PVD with prior history of aortofemoral bypass.  When I saw him in 2016 his LVF had remained low despite revascularization and I recommended AICD placement. He went bact to [place] and underwent placement of a St. Jude ICD placed 02/13/15 in [place]. Pt has been [facility] at [facility] by Dr. Lamont [place] and had normal device function at followup 10/2016. He is on chronic anticoagulation with coumadin. He lives in [place] and has cardiologist there and comes to [facility] for the winters.  He is here for followup.  He is doing well.  He denies any chest pain or pressure, SOB, DOE, LE edema, dizziness, palpitations or syncope.     Past Medical History:  Diagnosis Date  . Aortic stenosis 12/17/2013   s/p AVR  . Chronic systolic CHF (congestive heart failure), NYHA class 2 (HCC)   . Coronary artery disease   . DVT, lower extremity (HCC)   . Elevated troponin level 12/17/2013  . Embolism and thrombosis (HCC)   . GERD (gastroesophageal reflux disease)   . Gout    "a few times"  . H/O aorto-femoral bypass 12/17/2013  . H/O cardiac arrest    "3-4 times at least over various years" (12/17/2013)  . H/O hiatal hernia   . High cholesterol   . History of blood transfusion    "think related to aorto operation"  . Hypertension   . Ischemic dilated cardiomyopathy (HCC) 11/15/2014   EF 15-20% s/p St Jude AICD 02/2015 followed in  [place]  . NSTEMI (non-ST elevated myocardial infarction) (HCC) 2001; 2013   hx/notes 12/17/2013  . PE (pulmonary embolism)    hx  . Persistent atrial fibrillation (HCC)    CHADS2VASC score is 6  . Stented coronary artery   . Type II diabetes mellitus (HCC)     Past Surgical History:  Procedure Laterality Date  . AORTA - BILATERAL FEMORAL ARTERY BYPASS GRAFT  1990's   "feet had turned black"  . APPENDECTOMY    . CARDIAC CATHETERIZATION    . CORONARY ANGIOPLASTY WITH STENT PLACEMENT  ?2001   "I have a couple stents in my heart"  . GLAUCOMA SURGERY Right    "took cataract off; drilled hole in eyeball to relieve pressure"  . TIBIA FRACTURE SURGERY Right    "just below the knee"    Current Medications: Current Meds  Medication Sig  . allopurinol (ZYLOPRIM) 300 MG tablet Take 150 mg by mouth daily.  Marland Kitchen aspirin EC 81 MG tablet Take 81 mg by mouth daily.  Marland Kitchen atorvastatin (LIPITOR) 10 MG tablet Take 10 mg by mouth daily.  . carvedilol (COREG) 3.125 MG tablet Take 3.125 mg by mouth 2 (two) times daily with a meal.  . digoxin (LANOXIN) 0.125 MG tablet Take 0.125 mg by mouth daily.  Marland Kitchen  insulin aspart (NOVOLOG) 100 UNIT/ML injection Inject 32 Units into the skin 2 (two) times daily. As directed  . linagliptin (TRADJENTA) 5 MG TABS tablet Take 5 mg by mouth daily.  Marland Kitchen. lisinopril (PRINIVIL,ZESTRIL) 2.5 MG tablet Take 2.5 mg by mouth daily.  Marland Kitchen. PARoxetine (PAXIL) 10 MG tablet Take 10 mg by mouth daily.  . potassium chloride 20 MEQ TBCR Take 20 mEq by mouth daily.  Marland Kitchen. spironolactone (ALDACTONE) 25 MG tablet Take 25 mg by mouth daily.  . timolol (BETIMOL) 0.5 % ophthalmic solution Place 1 drop into the left eye daily.  Marland Kitchen. warfarin (COUMADIN) 5 MG tablet Take 0.5 tablets (2.5 mg total) by mouth daily at 6 PM.    Allergies:   Beef-derived products   Social History   Social History  . Marital status: Married    Spouse name: N/A  . Number of children: N/A  . Years of education: N/A   Social  History Main Topics  . Smoking status: Never Smoker  . Smokeless tobacco: Never Used  . Alcohol use Yes     Comment: 12/17/2013 "drink 1-2 beers on occasion in the summertime"  . Drug use: No  . Sexual activity: No   Other Topics Concern  . None   Social History Narrative  . None     Family History:  The patient's family history is not on file.   ROS:   Please see the history of present illness.    ROS All other systems reviewed and are negative.  No flowsheet data found.     PHYSICAL EXAM:   VS:  BP 100/60   Pulse 81   Ht 5' 10.5" (1.791 m)   Wt 193 lb (87.5 kg)   SpO2 98%   BMI 27.30 kg/m    GEN: Well nourished, well developed, in no acute distress  HEENT: normal  Neck: no JVD, carotid bruits, or masses Cardiac: RRR; no murmurs, rubs, or gallops.  Trace edema.  Intact distal pulses bilaterally.  Respiratory:  clear to auscultation bilaterally, normal work of breathing GI: soft, nontender, nondistended, + BS MS: no deformity or atrophy  Skin: warm and dry, no rash Neuro:  Alert and Oriented x 3, Strength and sensation are intact Psych: euthymic mood, full affect  Wt Readings from Last 3 Encounters:  11/25/16 193 lb (87.5 kg)  10/28/16 197 lb (89.4 kg)  11/15/14 166 lb (75.3 kg)      Studies/Labs Reviewed:   EKG:  EKG is ordered today.  The ekg ordered today demonstrates   Recent Labs: 10/28/2016: BUN 24; Creatinine, Ser 1.35; Platelets 205; Potassium 4.5; Sodium 136   Lipid Panel    Component Value Date/Time   CHOL 155 11/01/2014 0550   TRIG 72 11/01/2014 0550   HDL 27 (L) 11/01/2014 0550   CHOLHDL 5.7 11/01/2014 0550   VLDL 14 11/01/2014 0550   LDLCALC 114 (H) 11/01/2014 0550    Additional studies/ records that were reviewed today include:  none    ASSESSMENT:    1. Chronic systolic heart failure (HCC)   2. Coronary artery disease involving native coronary artery of native heart without angina pectoris   3. Chronic kidney disease, stage  III (moderate)   4. Aortic valvular disease   5. Persistent atrial fibrillation (HCC)   6. Ischemic dilated cardiomyopathy (HCC)   7. Diabetes mellitus without complication (HCC)      PLAN:  In order of problems listed above:  1. Chronic systolic HF -  echo2/2016 showed  severe LV dysfunction EF 15-20% with moderate pulmonary HTN with PASP .  He does not appear volume overloaded and weight is stable since he was seen a month ago.  He has NYHA Class 2a CHF.  2. CAD - prior MIs and prior PCI - now s/p CABG x 5 - looks to be doing well.  He has not had any anginal chest pain.  He will continue on ASA/statin/BB,  3. CKD - renal function has been stable and his creatinine last month was 1.35.  4. Severe AS - s/p AVR along with his CABG - done in [place] - he is doing well.  I will get a copy of his last echo. He will continue on ASA.  5. Persistent atrial fib - he is maintaining NSR with V paced rhythm.  He will continue on coumadin and BB.  6.  Ischemic DCM EF 15-20% s/p St. Jude AICD followed in [place].    Would continue on Coreg/aldactone/digoxin/ACE I  7.  Type 2 DM on Insulin.  He is followed by his PCP in [place].  Medication Adjustments/Labs and Tests Ordered: Current medicines are reviewed at length with the patient today.  Concerns regarding medicines are outlined above.  Medication changes, Labs and Tests ordered today are listed in the Patient Instructions below.  There are no Patient Instructions on file for this visit.   Signed, Omar Magic, MD  11/25/2016 1:34 PM    [facility] [address] [place], [place], [place]  [address] Phone: [phone]; Fax: [phone]

## 2016-12-09 ENCOUNTER — Ambulatory Visit (INDEPENDENT_AMBULATORY_CARE_PROVIDER_SITE_OTHER): Payer: Medicare Other | Admitting: *Deleted

## 2016-12-09 DIAGNOSIS — I420 Dilated cardiomyopathy: Secondary | ICD-10-CM

## 2016-12-09 DIAGNOSIS — I4891 Unspecified atrial fibrillation: Secondary | ICD-10-CM

## 2016-12-09 DIAGNOSIS — Z5181 Encounter for therapeutic drug level monitoring: Secondary | ICD-10-CM

## 2016-12-09 DIAGNOSIS — I481 Persistent atrial fibrillation: Secondary | ICD-10-CM

## 2016-12-09 DIAGNOSIS — I255 Ischemic cardiomyopathy: Secondary | ICD-10-CM

## 2016-12-09 DIAGNOSIS — I4819 Other persistent atrial fibrillation: Secondary | ICD-10-CM

## 2016-12-09 LAB — POCT INR: INR: 2.5

## 2021-01-04 DEATH — deceased
